# Patient Record
Sex: Male | Born: 1970 | Hispanic: No | Marital: Single | Smoking: Former smoker
Health system: Southern US, Community
[De-identification: ages and names within clinical notes are randomized; demographics above are authoritative.]

## PROBLEM LIST (undated history)

## (undated) DIAGNOSIS — F329 Major depressive disorder, single episode, unspecified: Secondary | ICD-10-CM

## (undated) DIAGNOSIS — I10 Essential (primary) hypertension: Secondary | ICD-10-CM

## (undated) DIAGNOSIS — F419 Anxiety disorder, unspecified: Secondary | ICD-10-CM

## (undated) DIAGNOSIS — E119 Type 2 diabetes mellitus without complications: Secondary | ICD-10-CM

## (undated) DIAGNOSIS — L97209 Non-pressure chronic ulcer of unspecified calf with unspecified severity: Secondary | ICD-10-CM

## (undated) DIAGNOSIS — M1990 Unspecified osteoarthritis, unspecified site: Secondary | ICD-10-CM

## (undated) DIAGNOSIS — E11622 Type 2 diabetes mellitus with other skin ulcer: Secondary | ICD-10-CM

## (undated) DIAGNOSIS — G4730 Sleep apnea, unspecified: Secondary | ICD-10-CM

## (undated) DIAGNOSIS — F32A Depression, unspecified: Secondary | ICD-10-CM

## (undated) HISTORY — PX: HERNIA REPAIR: SHX51

## (undated) HISTORY — PX: TOTAL KNEE ARTHROPLASTY: SHX125

## (undated) HISTORY — PX: TONSILLECTOMY: SUR1361

## (undated) HISTORY — PX: INCISION AND DRAINAGE DEEP NECK ABSCESS: SHX1797

---

## 2001-05-26 ENCOUNTER — Emergency Department (HOSPITAL_COMMUNITY): Admission: EM | Admit: 2001-05-26 | Discharge: 2001-05-26 | Payer: Self-pay | Admitting: Emergency Medicine

## 2001-05-26 ENCOUNTER — Encounter: Payer: Self-pay | Admitting: Emergency Medicine

## 2001-11-09 ENCOUNTER — Emergency Department (HOSPITAL_COMMUNITY): Admission: EM | Admit: 2001-11-09 | Discharge: 2001-11-09 | Payer: Self-pay

## 2002-07-05 ENCOUNTER — Emergency Department (HOSPITAL_COMMUNITY): Admission: EM | Admit: 2002-07-05 | Discharge: 2002-07-05 | Payer: Self-pay | Admitting: Emergency Medicine

## 2002-07-05 ENCOUNTER — Encounter: Payer: Self-pay | Admitting: Emergency Medicine

## 2002-08-03 ENCOUNTER — Encounter: Admission: RE | Admit: 2002-08-03 | Discharge: 2002-11-01 | Payer: Self-pay | Admitting: Orthopedic Surgery

## 2004-09-09 ENCOUNTER — Emergency Department (HOSPITAL_COMMUNITY): Admission: EM | Admit: 2004-09-09 | Discharge: 2004-09-09 | Payer: Self-pay | Admitting: Emergency Medicine

## 2005-04-20 ENCOUNTER — Emergency Department (HOSPITAL_COMMUNITY): Admission: EM | Admit: 2005-04-20 | Discharge: 2005-04-20 | Payer: Self-pay | Admitting: Emergency Medicine

## 2005-05-26 ENCOUNTER — Encounter (INDEPENDENT_AMBULATORY_CARE_PROVIDER_SITE_OTHER): Payer: Self-pay | Admitting: *Deleted

## 2005-05-26 ENCOUNTER — Ambulatory Visit (HOSPITAL_COMMUNITY): Admission: RE | Admit: 2005-05-26 | Discharge: 2005-05-26 | Payer: Self-pay | Admitting: General Surgery

## 2005-10-16 ENCOUNTER — Ambulatory Visit: Payer: Self-pay | Admitting: Pulmonary Disease

## 2005-10-16 ENCOUNTER — Ambulatory Visit (HOSPITAL_BASED_OUTPATIENT_CLINIC_OR_DEPARTMENT_OTHER): Admission: RE | Admit: 2005-10-16 | Discharge: 2005-10-16 | Payer: Self-pay | Admitting: Pulmonary Disease

## 2005-10-26 ENCOUNTER — Ambulatory Visit: Payer: Self-pay | Admitting: Pulmonary Disease

## 2005-11-10 ENCOUNTER — Ambulatory Visit: Payer: Self-pay | Admitting: Pulmonary Disease

## 2005-12-08 ENCOUNTER — Ambulatory Visit: Payer: Self-pay | Admitting: Emergency Medicine

## 2006-02-04 ENCOUNTER — Ambulatory Visit: Payer: Self-pay | Admitting: Pulmonary Disease

## 2006-03-01 ENCOUNTER — Observation Stay (HOSPITAL_COMMUNITY): Admission: RE | Admit: 2006-03-01 | Discharge: 2006-03-02 | Payer: Self-pay | Admitting: Otolaryngology

## 2006-03-01 ENCOUNTER — Encounter (INDEPENDENT_AMBULATORY_CARE_PROVIDER_SITE_OTHER): Payer: Self-pay | Admitting: Specialist

## 2006-10-18 ENCOUNTER — Emergency Department (HOSPITAL_COMMUNITY): Admission: EM | Admit: 2006-10-18 | Discharge: 2006-10-18 | Payer: Self-pay | Admitting: Emergency Medicine

## 2006-10-26 ENCOUNTER — Ambulatory Visit: Payer: Self-pay | Admitting: Emergency Medicine

## 2006-11-02 ENCOUNTER — Ambulatory Visit: Payer: Self-pay | Admitting: Pulmonary Disease

## 2007-03-02 ENCOUNTER — Ambulatory Visit: Payer: Self-pay | Admitting: Pulmonary Disease

## 2007-03-09 ENCOUNTER — Ambulatory Visit: Payer: Self-pay | Admitting: Pulmonary Disease

## 2007-12-26 ENCOUNTER — Emergency Department (HOSPITAL_COMMUNITY): Admission: EM | Admit: 2007-12-26 | Discharge: 2007-12-26 | Payer: Self-pay | Admitting: Emergency Medicine

## 2008-02-18 ENCOUNTER — Emergency Department (HOSPITAL_COMMUNITY): Admission: EM | Admit: 2008-02-18 | Discharge: 2008-02-18 | Payer: Self-pay | Admitting: Emergency Medicine

## 2008-02-27 ENCOUNTER — Emergency Department (HOSPITAL_COMMUNITY): Admission: EM | Admit: 2008-02-27 | Discharge: 2008-02-27 | Payer: Self-pay | Admitting: Emergency Medicine

## 2009-04-10 ENCOUNTER — Emergency Department (HOSPITAL_COMMUNITY): Admission: EM | Admit: 2009-04-10 | Discharge: 2009-04-10 | Payer: Self-pay | Admitting: Emergency Medicine

## 2009-06-01 ENCOUNTER — Emergency Department (HOSPITAL_COMMUNITY): Admission: EM | Admit: 2009-06-01 | Discharge: 2009-06-01 | Payer: Self-pay | Admitting: Emergency Medicine

## 2009-06-15 ENCOUNTER — Emergency Department (HOSPITAL_COMMUNITY): Admission: EM | Admit: 2009-06-15 | Discharge: 2009-06-15 | Payer: Self-pay | Admitting: Emergency Medicine

## 2010-05-28 ENCOUNTER — Emergency Department (HOSPITAL_COMMUNITY): Admission: EM | Admit: 2010-05-28 | Discharge: 2010-05-28 | Payer: Self-pay | Admitting: Emergency Medicine

## 2010-07-26 ENCOUNTER — Emergency Department (HOSPITAL_COMMUNITY): Admission: EM | Admit: 2010-07-26 | Discharge: 2010-07-26 | Payer: Self-pay | Admitting: Emergency Medicine

## 2010-11-16 ENCOUNTER — Emergency Department (HOSPITAL_COMMUNITY)
Admission: EM | Admit: 2010-11-16 | Discharge: 2010-11-16 | Payer: Self-pay | Source: Home / Self Care | Admitting: Emergency Medicine

## 2011-02-09 LAB — GLUCOSE, CAPILLARY
Glucose-Capillary: 386 mg/dL — ABNORMAL HIGH (ref 70–99)
Glucose-Capillary: 492 mg/dL — ABNORMAL HIGH (ref 70–99)
Glucose-Capillary: >600 mg/dL (ref 70–99)

## 2011-02-09 LAB — COMPREHENSIVE METABOLIC PANEL
Albumin: 3.5 g/dL (ref 3.5–5.2)
BUN: 11 mg/dL (ref 6–23)
Chloride: 93 mEq/L — ABNORMAL LOW (ref 96–112)
GFR calc Af Amer: >60 mL/min (ref >60)
GFR calc non Af Amer: >60 mL/min (ref >60)
Potassium: 4.8 mEq/L (ref 3.5–5.1)

## 2011-02-09 LAB — DIFFERENTIAL
Basophils Relative: 0 % (ref 0–1)
Eosinophils Absolute: 0.1 10*3/uL (ref 0.0–0.7)
Eosinophils Relative: 1 % (ref 0–5)
Lymphocytes Relative: 39 % (ref 12–46)

## 2011-02-09 LAB — CBC
HCT: 52.3 % — ABNORMAL HIGH (ref 39.0–52.0)
Hemoglobin: 17.3 g/dL — ABNORMAL HIGH (ref 13.0–17.0)
MCH: 28 pg (ref 26.0–34.0)
MCV: 84.8 fL (ref 78.0–100.0)
RBC: 6.17 MIL/uL — ABNORMAL HIGH (ref 4.22–5.81)

## 2011-02-09 LAB — URINALYSIS, ROUTINE W REFLEX MICROSCOPIC
Bilirubin Urine: NEGATIVE
Glucose, UA: >1000 mg/dL — AB
Hgb urine dipstick: NEGATIVE
Ketones, ur: NEGATIVE mg/dL
Specific Gravity, Urine: 1.029 (ref 1.005–1.030)
pH: 6.5 (ref 5.0–8.0)

## 2011-02-09 LAB — URINE MICROSCOPIC-ADD ON

## 2011-02-13 LAB — DIFFERENTIAL
Basophils Absolute: 0 10*3/uL (ref 0.0–0.1)
Eosinophils Absolute: 0.2 10*3/uL (ref 0.0–0.7)
Neutro Abs: 6.3 10*3/uL (ref 1.7–7.7)
Neutrophils Relative %: 56 % (ref 43–77)

## 2011-02-13 LAB — GLUCOSE, CAPILLARY: Glucose-Capillary: 315 mg/dL — ABNORMAL HIGH (ref 70–99)

## 2011-02-13 LAB — HEPATIC FUNCTION PANEL
AST: 41 U/L — ABNORMAL HIGH (ref 0–37)
Bilirubin, Direct: 0.1 mg/dL (ref 0.0–0.3)

## 2011-02-13 LAB — CBC
HCT: 50.9 % (ref 39.0–52.0)
Hemoglobin: 16.9 g/dL (ref 13.0–17.0)
MCH: 28.3 pg (ref 26.0–34.0)
MCHC: 33.2 g/dL (ref 30.0–36.0)
RBC: 5.98 MIL/uL — ABNORMAL HIGH (ref 4.22–5.81)

## 2011-02-13 LAB — URINALYSIS, ROUTINE W REFLEX MICROSCOPIC
Glucose, UA: >1000 mg/dL — AB
Leukocytes, UA: NEGATIVE
Protein, ur: NEGATIVE mg/dL
Urobilinogen, UA: 1 mg/dL (ref 0.0–1.0)

## 2011-02-13 LAB — BASIC METABOLIC PANEL
BUN: 15 mg/dL (ref 6–23)
Calcium: 8.8 mg/dL (ref 8.4–10.5)
Creatinine, Ser: 1.2 mg/dL (ref 0.4–1.5)
Glucose, Bld: 517 mg/dL — ABNORMAL HIGH (ref 70–99)
Potassium: 3.9 mEq/L (ref 3.5–5.1)
Sodium: 129 mEq/L — ABNORMAL LOW (ref 135–145)

## 2011-02-13 LAB — D-DIMER, QUANTITATIVE: D-Dimer, Quant: 0.33 ug/mL-FEU (ref 0.00–0.48)

## 2011-02-13 LAB — URINE MICROSCOPIC-ADD ON

## 2011-02-13 LAB — POCT CARDIAC MARKERS
CKMB, poc: 2.4 ng/mL (ref 1.0–8.0)
Troponin i, poc: <0.05 ng/mL (ref 0.00–0.09)

## 2011-02-15 LAB — DIFFERENTIAL
Basophils Absolute: 0 10*3/uL (ref 0.0–0.1)
Basophils Relative: 0 % (ref 0–1)
Eosinophils Absolute: 0.1 10*3/uL (ref 0.0–0.7)
Monocytes Absolute: 1.1 10*3/uL — ABNORMAL HIGH (ref 0.1–1.0)
Monocytes Relative: 9 % (ref 3–12)
Neutro Abs: 7.1 10*3/uL (ref 1.7–7.7)
Neutrophils Relative %: 61 % (ref 43–77)

## 2011-02-15 LAB — POCT I-STAT, CHEM 8
Calcium, Ion: 1.02 mmol/L — ABNORMAL LOW (ref 1.12–1.32)
Glucose, Bld: 199 mg/dL — ABNORMAL HIGH (ref 70–99)
HCT: 59 % — ABNORMAL HIGH (ref 39.0–52.0)
Hemoglobin: 20.1 g/dL — ABNORMAL HIGH (ref 13.0–17.0)
Potassium: 3.7 mEq/L (ref 3.5–5.1)

## 2011-02-15 LAB — CBC
Hemoglobin: 16.4 g/dL (ref 13.0–17.0)
MCH: 28 pg (ref 26.0–34.0)
MCHC: 31.9 g/dL (ref 30.0–36.0)
RDW: 15 % (ref 11.5–15.5)
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
## 2011-04-17 NOTE — Assessment & Plan Note (Signed)
[facility]                             PULMONARY OFFICE NOTE   NAME:BECTONElmon, Shader                        MRN:          [id number]  DATE:11/02/2006                            DOB:          [birth year]    I saw Mr. Welshans today for followup of his severe obstructive sleep  apnea.  I   n summary again is that he had his diagnostic study on October 16, 2005, where he was found to have severe obstructive sleep apnea with an  apnea-hypopnea index of 106 and an oxygen saturation nadir of 50% and he  was not able to tolerate CPAP during his second half of his split night  study.  He then had an auto CPAP titration study with minimal use, at  which time he was on a pressure setting of 10, although he did appear to  have significant improvement in his AIH when he using a machine.  He was  then subsequently switched to BiPAP of 14/10.  He had undergone  uvulopharyngoplasty in the spring of 2007.  He says prior to this, he  was just starting to get used to using his BiPAP machine.  He says that,  however, after his surgery, he stopped using his BiPAP machine.  Apparently initially after the surgery, he felt like his sleep had  gotten much better and he was not having much difficulty as far as  feeling sleepy during the day.  Of note is that he was not able to eat  very much after he had his surgery and he lost approximately 20 pounds.  After his surgical site had healed and he was able to eat the way he  normally did before, he regained his weight and his sleep problems  returned.  He currently works 2nd shift from 2 p.m. to 10:30 p.m.  He  will come home by about 11 o'clock at night and he will stay awake until  3 or 4 o'clock in the morning, at which time he goes to sleep; however,  his sleep is quite disrupted and he is having to use the bathroom quite  a bit at night.  He will get up in the morning between 9 and 10 o'clock,  but then ends up falling  asleep several times throughout the remainder  of the day.  He is not currently driving and he is currently in the  process of applying for disability.  He had also looked into undergoing  gastric bypass surgery and he is currently trying an over-the-counter  diet aid.  Otherwise, he is not on any medications.   PHYSICAL EXAM:  He is 323 pounds.  Temperature is 98.  Blood pressure is  130/88.  Heart rate is 106.  Oxygen saturation is 96% on room air.  HEENT:  There is no sinus tenderness, no nasal discharge.  He has a  Mallampati IV airway and he is status post UPPP.  There is no lymphadenopathy.  HEART:  S1 and S2.  CHEST:  There were decreased breath sounds, but no wheezing or rales.  ABDOMEN:  Obese, soft, nontender.  EXTREMITIES:  He has 1+ edema of his ankles.   IMPRESSION:  Very severe obstructive sleep apnea.  He is now status post  uvulopalatopharyngoplasty.  My suspicion is that the reason why he had  initial benefit from his uvulopalatopharyngoplasty is actually due to  his weight loss because he was not able to eat, rather than any  significant benefit from the surgery itself.  However, given the fact  that his upper airway anatomy has changed since he had his surgery, I  would like for him to undergo a repeat titration study to determine what  would be the optimal pressure setting for his bi-level positive airway  pressure machine.  In the meantime, I have discussed with him techniques  to try and acclimatize to the use of his bi-level positive airway  pressure machine and I have advised him to start using his machine again  until we have the results of this repeat titration study.  I have also  had a detailed discussion with him regarding the importance of diet,  exercise and weight reduction.  I will make a referral to the dietitian  to further discuss dietary modifications. I have also discussed with him  that diet aids generally are not effective for long-term use and  could  be associated with untoward health consequences.  With regards to the  gastric bypass surgery, I have reviewed possible side-effects related to  this and I had also discussed with him that even if he were to undergo  this type of surgery, he would still need to have a diet and exercise  program afterwards and therefore it would be best to try and do these  measures beforehand, but certainly gastric bypass surgery could be an  option.  I have also discussed with him that if he is not able to  tolerate bi-level positive airway pressure therapy and he is not able to  lose a significant amount of weight, that he may in fact need to have a  tracheostomy placed to control his sleep apnea, as he would likely be at  high risk for developing significant cardiovascular consequences due to  the severity of his sleep apnea.  Additionally, he is due to bring back  his papers to be filled out for applying for disability due to his sleep  apnea and I again have emphasized the point that he should not drive or  operate heavy machinery until his sleep apnea is under adequate control.     Coralyn Helling, MD  Electronically Signed    VS/MedQ  DD: 11/02/2006  DT: 11/03/2006  Job #: [id number]

## 2011-04-17 NOTE — Op Note (Signed)
NAME:  SALIH, WILLIAMSON               ACCOUNT NO.:  [id number]   MEDICAL RECORD NO.:  [id number]          PATIENT TYPE:  OBV   LOCATION:  3309                         FACILITY:  [facility]   PHYSICIAN:  Kristine Garbe. Ezzard Standing, M.D.DATE OF BIRTH:  1971-04-13   DATE OF PROCEDURE:  03/01/2006  DATE OF DISCHARGE:  03/02/2006                                 OPERATIVE REPORT   PREOPERATIVE DIAGNOSES:  1.  Obstructive sleep apnea with tonsillar hypertrophy.  2.  Turbinate hypertrophy.   POSTOPERATIVE DIAGNOSES:  1.  Obstructive sleep apnea with tonsillar hypertrophy.  2.  Turbinate hypertrophy.   OPERATION PERFORMED:  Bilateral inferior turbinate reductions.  Uvulopalatopharyngoplasty with tonsillectomy.   SURGEON:  Kristine Garbe. Ezzard Standing, M.D.   ANESTHESIA:  General.   COMPLICATIONS:  None.   INDICATIONS FOR PROCEDURE:  Jacub Waiters is a 40 year old gentleman who has  severe sleep apnea. He has been using BIPAP but has had problems with nasal  obstruction and on exam has large turbinates with minimal septal deformity  as well as large tonsils.  He is taken to the operating room at this time  for turbinate reductions to help improve the nasal airway, tonsillectomy and  limited palatoplasty.  Of note he has a very long uvula in addition to the  large tonsils.   DESCRIPTION OF PROCEDURE:  After adequate endotracheal anesthesia, nose was  prepped with cotton pledgets soaked in Afrin.  Turbinates were injected with  Xylocaine with epinephrine.  On exam, the patient had large inferior as well  as middle turbinates.  In addition, he had a large septal spur  posterolaterally on the left side.  Incision was made along the anterior  inferior edge of the inferior turbinates.  Mucosa was elevated off the  turbinate bone and then the submucosal turbinate bone was removed from  inferior turbinates.  The submucosal tissue was then cauterized with suction  cautery and the turbinates were outfractured.   This was performed  bilaterally.  In addition, on the left side of the nose, the patient had a  large septal spur and the septal spur was just removed with Lenoria Chime  forceps.  The mucosa posteriorly where the spur was removed was cauterized  with suction cautery.  This completed the turbinate reductions.  Next, the  patient was turned and the mouth gag was used to expose the oropharynx.  Alexandre had a large tongue base as well as large tonsils and a long uvula.  The tonsils were resected from the tonsillar fossae using a cautery.  In  addition a portion of the posterior tonsillar pillar was excised.  The uvula  was then transected at its base of the attachment to the soft palate.  Hemostasis was obtained with cautery.  Oropharynx was irrigated with saline  and then the cut edges of the palate were reapproximated with 3-0 Vicryl and  3-0 chromic sutures.  This completed the procedure.  Javaun was awakened  from anesthesia and transferred to recovery room postoperatively doing well.   DISPOSITION:  Robel will be observed overnight because of obstructive sleep  apnea.  Will plan on  discharging him in the morning on amoxicillin  suspension 500 mg twice daily for one week, Tylenol with Lortab elixir 15 to  30 mL every four hours as needed for pain. Will have him follow up in my  office in two weeks for recheck.           ______________________________  Kristine Garbe. Ezzard Standing, M.D.     CEN/MEDQ  D:  03/01/2006  T:  03/02/2006  Job:  [id number]   cc:   Marcelyn Bruins, M.D. [facility]  [address]  [place]  [place] [address]

## 2011-04-17 NOTE — Op Note (Signed)
NAME:  Jose Jackson, Jose Jackson               ACCOUNT NO.:  [id number]   MEDICAL RECORD NO.:  [id number]          PATIENT TYPE:  AMB   LOCATION:  DAY                          FACILITY:  [facility]   PHYSICIAN:  Anselm Pancoast. Weatherly, M.D.DATE OF BIRTH:  03/21/1971   DATE OF PROCEDURE:  05/26/2005  DATE OF DISCHARGE:                                 OPERATIVE REPORT   PREOPERATIVE DIAGNOSIS:  Ventral hernia, supraumbilical and also umbilical.   OPERATION:  Repair of a ventral umbilical hernia with mesh.   ANESTHESIA:  General.   SURGEON:  Anselm Pancoast. Zachery Dakins, M.D.   HISTORY:  Jose Jackson is a 40 year old male who weighs about 300 pounds.  He was sent to our office from the [facility] emergency room where he presented  with pain just above the umbilical area.  The patient had an acute abdominal  x-rays and a diagnosis of hernia made.  You could feel the hernia.  I saw  him in the office last week.  This is an egg-sized defect above the  umbilicus with the patient presently working for, I think they make  colostomy supplies.  He is 6 foot 3 and weighs 292 pounds.  He is on no  chronic medications.  I recommended that we repair this with mesh, and the  patient is here for the planned procedure.  The patient originally was  planned over at [facility] outpatient but because of our schedule, we were not  able to get completed by 3:00, so he was switched over to the operating  room.  The patient was taken back to the operative suite, given 1 gm of  Kefzol, and positioned on the table, and induction of general anesthesia.  Patient probably has a history of sleep apnea and small area.  After the  abdomen was prepped with Betadine, sterile scrub and solution, was draped in  a sterile manner.  I made about a 3 inch incision above the umbilicus and  dissected down into the subcutaneous tissue.  There was an egg-sized hernia  sac through about a quarter-sized fascial defect, but he has a defect at the  umbilicus, and  everything is just tented out.  I opened the fascia about 3  inches, then actually removed the hernia sac, and then sent it for  pathology.  I then closed the peritoneum with a running 2-0 Vicryl.  I  worked all the way around so that I could get a piece of mesh probably about  4-5 inches by about 2-1/2 inches in up under the fascial edges on both sides  and then used actually an Endoclose to kind of bring it down below the  umbilicus inferiorly.  This was all done under direct vision.  The mesh is  outside of the peritoneum but inside the fascia.  I placed a midline stitch  of 0 Prolene kind of below the umbilicus, and then the other sutures were  placed through the wound but actually lateral to the fascia so that the mesh  just kind of tented out straight.  There is about three sutures on each  side,  top and bottom, and then the actual fascia was closed, picking up the  piece of mesh enclosed in the fascial sutures, which were also 0 Prolene  interrupted sutures.  The fascia comes together without significant tension,  and then the subcutaneous wound was closed with 3-0 Vicryl and then the skin  closed with staples.  The patient will be released after a short stay in the  recovery room and will be followed and seen in the office for removal of his  staples in approximately a week.  He will be off work for about two weeks.  Hopefully, he will not have other problems.  The patient really needs to  work on dietary management, 300 pounds.     WJW/MEDQ  D:  05/26/2005  T:  05/26/2005  Job:  [id number]

## 2011-04-17 NOTE — Procedures (Signed)
NAME:  Jose Jackson, OFALLON NO.:  [id number]   MEDICAL RECORD NO.:  [id number]          PATIENT TYPE:  OUT   LOCATION:  SLEEP CENTER                 FACILITY:  [facility]   PHYSICIAN:  Marcelyn Bruins, M.D. [facility] DATE OF BIRTH:  [birth year]   DATE OF STUDY:  10/16/2005                              NOCTURNAL POLYSOMNOGRAM   REFERRING PHYSICIAN:  Dr. Marcelyn Bruins   INDICATION FOR STUDY:  Hypersomnia with sleep apnea.   EPWORTH SLEEPINESS SCORE:  16.   SLEEP ARCHITECTURE:  Sleep architecture: The patient total sleep time of 265  minutes with decreased REM and he never achieved slow wave sleep. Sleep  onset was very rapid at 2-1/2 minutes and REM onset was prolonged at 199  minutes.  Sleep efficiency was 75%.   RESPIRATORY DATA:  The patient was found to have 458 apneas and six  hypopneas for a respiratory disturbance index of 106 events per hour. These  events were not positional, but they were very severe  during REM. The  patient did not meet split night criteria and was fitted for nasal pillows  but was unable to tolerate CPAP or BiPAP because of severe anxiety.  There  was moderate snoring noted prior to attempts at C-PAP.   OXYGEN DATA:  There was severe O2 desaturation during REM as low as 50%.   CARDIAC DATA:  No clinically significant cardiac arrhythmias.   MOVEMENT/PARASOMNIA:  The patient had no significant leg jerks or abnormal  behaviors during the night.   IMPRESSION/RECOMMENDATIONS:  Very severe obstructive sleep apnea with a  respiratory disturbance index of 106 events per hour with desaturation as  low as 50%. The patient will need C-PAP therapy as well as aggressive weight  loss.  In order to facilitate tolerance of a positive pressure device I  would initiate BiPAP at home along with and an anxiolytic and  desensitization.           ______________________________  Marcelyn Bruins, M.D. [facility]  Diplomate, American Board of Sleep  Medicine     KC/MEDQ  D:   10/23/2005 13:40:40  T:  10/23/2005 16:21:40  Job:  [id number]

## 2011-08-24 LAB — DIFFERENTIAL
Basophils Relative: 0
Basophils Relative: 1
Eosinophils Absolute: 0.2
Eosinophils Absolute: 0.3
Eosinophils Relative: 1
Monocytes Absolute: 1.1 — ABNORMAL HIGH
Monocytes Absolute: 1.3 — ABNORMAL HIGH
Monocytes Relative: 10
Monocytes Relative: 10
Neutro Abs: 7
Neutrophils Relative %: 52

## 2011-08-24 LAB — COMPREHENSIVE METABOLIC PANEL
ALT: 35
AST: 24
Albumin: 3.7
Alkaline Phosphatase: 63
Chloride: 99
Potassium: 4.1
Sodium: 135
Total Bilirubin: 1.1
Total Protein: 6.5

## 2011-08-24 LAB — CBC
MCHC: 33.1
MCV: 85
Platelets: 279
RBC: 5.89 — ABNORMAL HIGH
RDW: 14.1
RDW: 14.4
WBC: 12.7 — ABNORMAL HIGH

## 2011-08-24 LAB — POCT I-STAT, CHEM 8
Calcium, Ion: 1.12
Creatinine, Ser: 1.1
Glucose, Bld: 112 — ABNORMAL HIGH
Hemoglobin: 19.4 — ABNORMAL HIGH
Potassium: 4.1

## 2011-12-21 IMAGING — CR DG FOOT COMPLETE 3+V*R*
3 series · 3 of 3 positions shown · non-contrast
Comparison: Right foot radiographs 09/09/2004

CLINICAL DATA: Right foot pain and swelling.

RIGHT FOOT COMPLETE - 3+ VIEW

[t foot ap right]
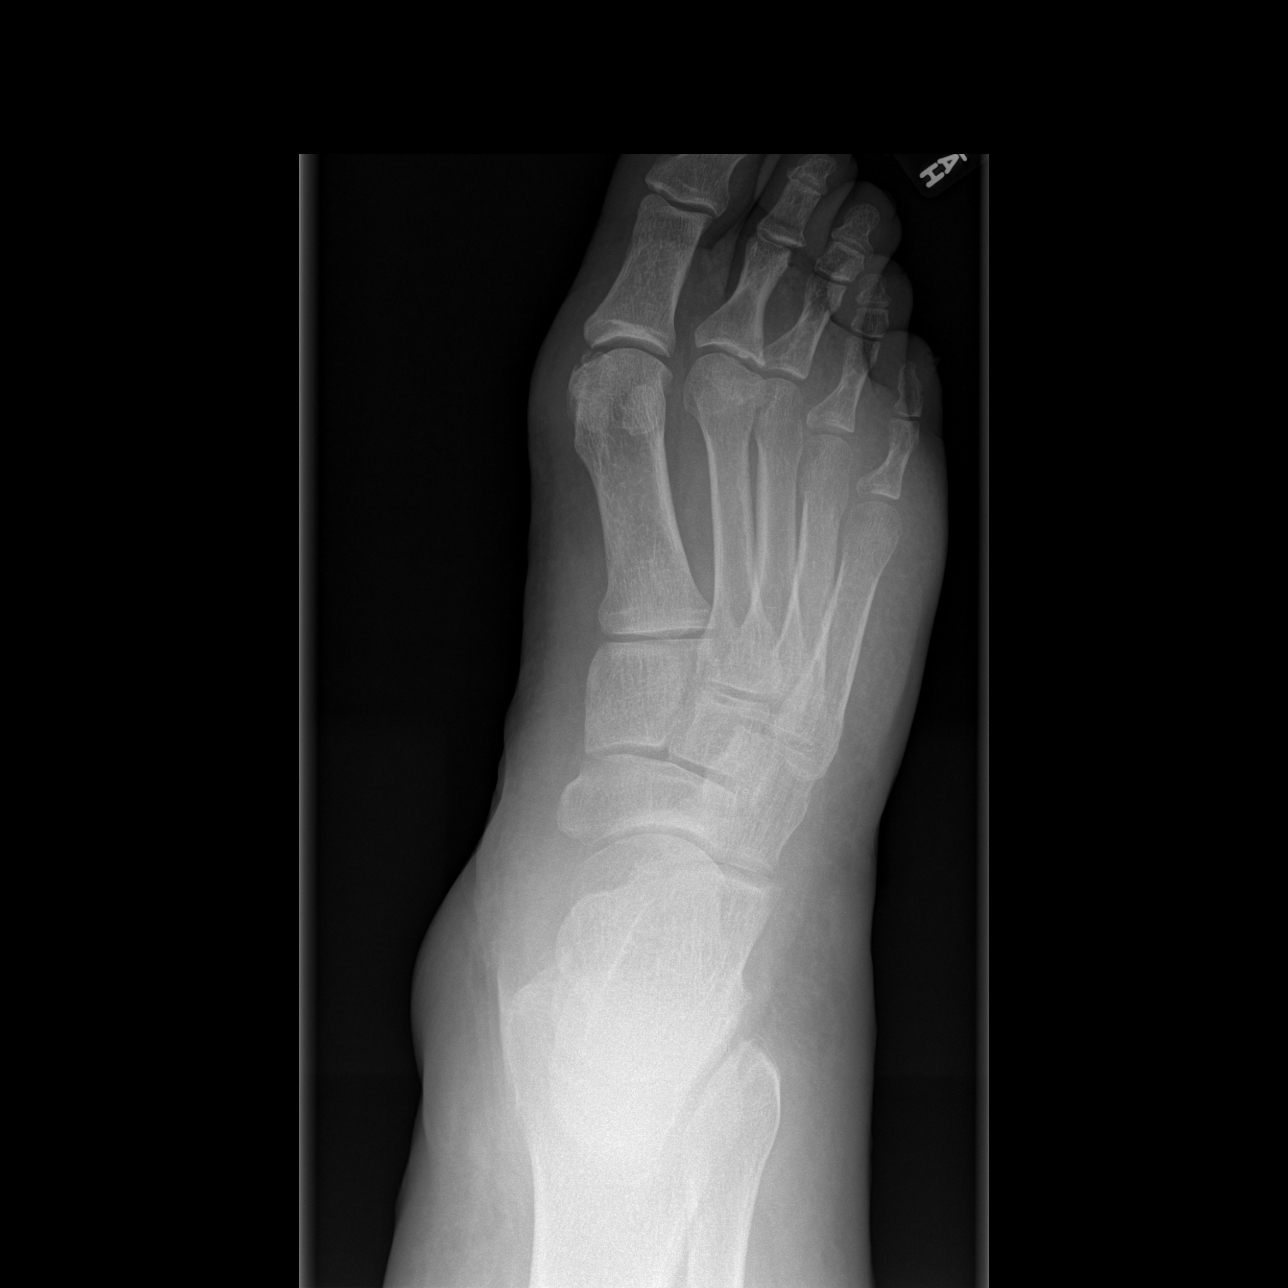

[t foot oblique right]
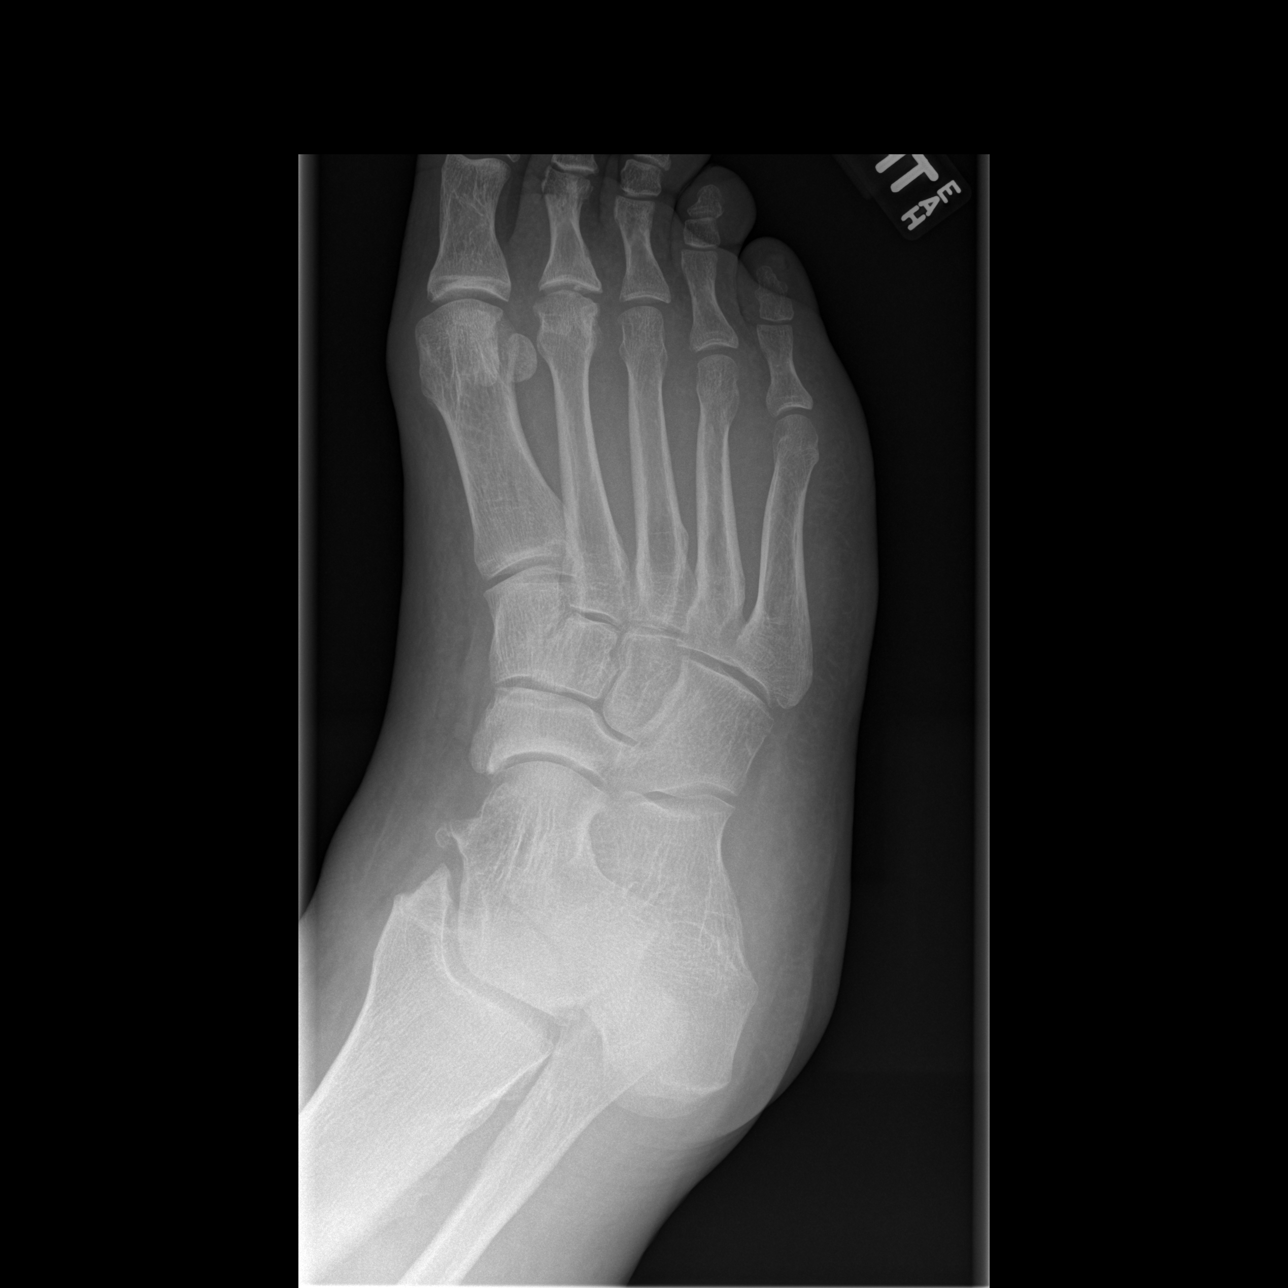

[t foot lat right]
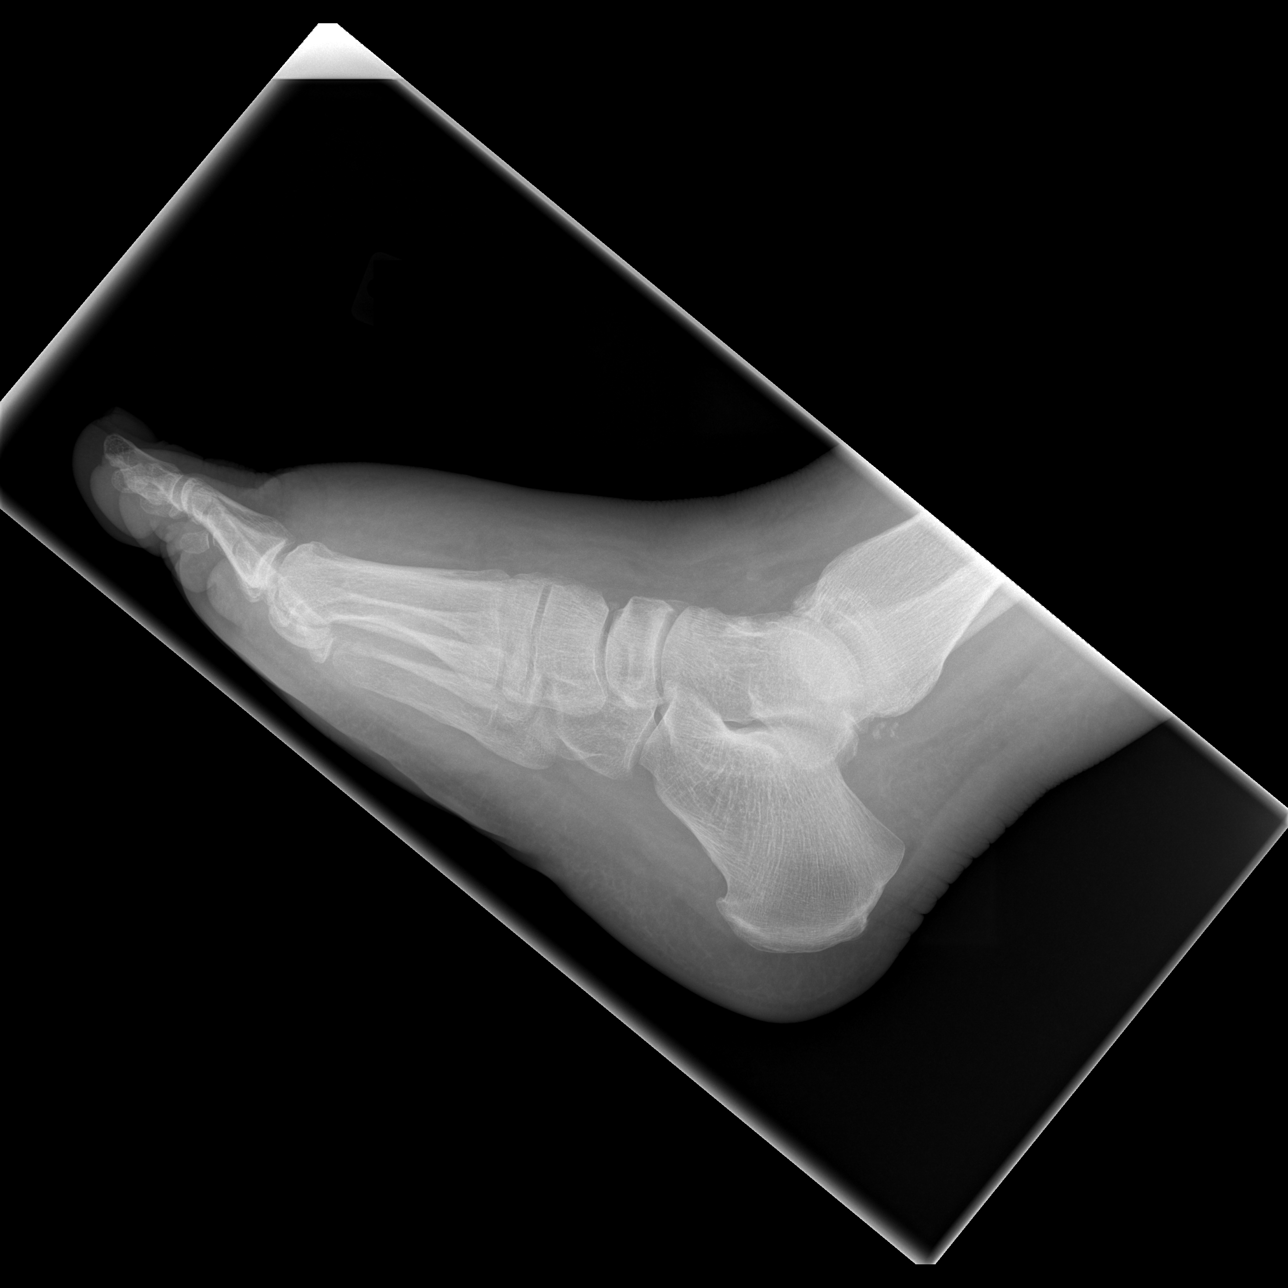

[3 of 3 positions shown; findings below may reference images not displayed]

FINDINGS: The joint spaces are maintained.  There are degenerative
changes at the second metatarsal phalangeal joint and irregularity
of the second metatarsal heads suggesting remote Avena
infraction.  No acute bony findings.  Diffuse, largely dorsal, soft
tissue swelling.
IMPRESSION: 1.  No acute bony findings.
2.  Remote changes of Avena infraction involving the second
metatarsal head.

## 2014-01-08 ENCOUNTER — Encounter (HOSPITAL_COMMUNITY): Payer: Self-pay | Admitting: Emergency Medicine

## 2014-01-08 ENCOUNTER — Inpatient Hospital Stay (HOSPITAL_COMMUNITY)
Admission: EM | Admit: 2014-01-08 | Discharge: 2014-01-09 | DRG: 603 | Disposition: A | Discharge status: Home / Self Care | Payer: Medicare Other | Attending: Family Medicine | Admitting: Family Medicine

## 2014-01-08 ENCOUNTER — Emergency Department (HOSPITAL_COMMUNITY): Payer: Medicare Other

## 2014-01-08 DIAGNOSIS — Z79899 Other long term (current) drug therapy: Secondary | ICD-10-CM

## 2014-01-08 DIAGNOSIS — R0989 Other specified symptoms and signs involving the circulatory and respiratory systems: Secondary | ICD-10-CM | POA: Diagnosis present

## 2014-01-08 DIAGNOSIS — R0609 Other forms of dyspnea: Secondary | ICD-10-CM | POA: Diagnosis present

## 2014-01-08 DIAGNOSIS — L02419 Cutaneous abscess of limb, unspecified: Principal | ICD-10-CM | POA: Diagnosis present

## 2014-01-08 DIAGNOSIS — R0600 Dyspnea, unspecified: Secondary | ICD-10-CM | POA: Diagnosis present

## 2014-01-08 DIAGNOSIS — L03119 Cellulitis of unspecified part of limb: Principal | ICD-10-CM

## 2014-01-08 DIAGNOSIS — E669 Obesity, unspecified: Secondary | ICD-10-CM | POA: Diagnosis present

## 2014-01-08 DIAGNOSIS — I10 Essential (primary) hypertension: Secondary | ICD-10-CM | POA: Diagnosis present

## 2014-01-08 DIAGNOSIS — Z6841 Body Mass Index (BMI) 40.0 and over, adult: Secondary | ICD-10-CM

## 2014-01-08 DIAGNOSIS — E119 Type 2 diabetes mellitus without complications: Secondary | ICD-10-CM | POA: Diagnosis present

## 2014-01-08 DIAGNOSIS — L03115 Cellulitis of right lower limb: Secondary | ICD-10-CM | POA: Diagnosis present

## 2014-01-08 HISTORY — DX: Type 2 diabetes mellitus without complications: E11.9

## 2014-01-08 HISTORY — DX: Major depressive disorder, single episode, unspecified: F32.9

## 2014-01-08 HISTORY — DX: Unspecified osteoarthritis, unspecified site: M19.90

## 2014-01-08 HISTORY — DX: Non-pressure chronic ulcer of unspecified calf with unspecified severity: L97.209

## 2014-01-08 HISTORY — DX: Type 2 diabetes mellitus with other skin ulcer: E11.622

## 2014-01-08 HISTORY — DX: Anxiety disorder, unspecified: F41.9

## 2014-01-08 HISTORY — DX: Sleep apnea, unspecified: G47.30

## 2014-01-08 HISTORY — DX: Essential (primary) hypertension: I10

## 2014-01-08 HISTORY — DX: Depression, unspecified: F32.A

## 2014-01-08 LAB — TROPONIN I
Troponin I: <0.3 ng/mL (ref <0.30)
Troponin I: <0.3 ng/mL (ref <0.30)
Troponin I: <0.3 ng/mL (ref <0.30)

## 2014-01-08 LAB — CBC WITH DIFFERENTIAL/PLATELET
BASOS ABS: 0 10*3/uL (ref 0.0–0.1)
BASOS PCT: 0 % (ref 0–1)
EOS ABS: 0.1 10*3/uL (ref 0.0–0.7)
Eosinophils Relative: 1 % (ref 0–5)
HCT: 54 % — ABNORMAL HIGH (ref 39.0–52.0)
HEMOGLOBIN: 17.5 g/dL — AB (ref 13.0–17.0)
Lymphocytes Relative: 26 % (ref 12–46)
Lymphs Abs: 2.6 10*3/uL (ref 0.7–4.0)
MCH: 28 pg (ref 26.0–34.0)
MCHC: 32.4 g/dL (ref 30.0–36.0)
MCV: 86.5 fL (ref 78.0–100.0)
MONOS PCT: 10 % (ref 3–12)
Monocytes Absolute: 1.1 10*3/uL — ABNORMAL HIGH (ref 0.1–1.0)
NEUTROS ABS: 6.5 10*3/uL (ref 1.7–7.7)
NEUTROS PCT: 63 % (ref 43–77)
PLATELETS: 244 10*3/uL (ref 150–400)
RBC: 6.24 MIL/uL — ABNORMAL HIGH (ref 4.22–5.81)
RDW: 14.8 % (ref 11.5–15.5)
WBC: 10.3 10*3/uL (ref 4.0–10.5)

## 2014-01-08 LAB — COMPREHENSIVE METABOLIC PANEL
ALK PHOS: 70 U/L (ref 39–117)
ALT: 35 U/L (ref 0–53)
AST: 25 U/L (ref 0–37)
Albumin: 3.4 g/dL — ABNORMAL LOW (ref 3.5–5.2)
BILIRUBIN TOTAL: 0.7 mg/dL (ref 0.3–1.2)
BUN: 11 mg/dL (ref 6–23)
CALCIUM: 8.5 mg/dL (ref 8.4–10.5)
CHLORIDE: 101 meq/L (ref 96–112)
CO2: 27 meq/L (ref 19–32)
Creatinine, Ser: 0.87 mg/dL (ref 0.50–1.35)
GLUCOSE: 184 mg/dL — AB (ref 70–99)
Potassium: 4.4 mEq/L (ref 3.7–5.3)
SODIUM: 141 meq/L (ref 137–147)
Total Protein: 6.9 g/dL (ref 6.0–8.3)

## 2014-01-08 LAB — CBC
HCT: 55.2 % — ABNORMAL HIGH (ref 39.0–52.0)
HEMOGLOBIN: 17.8 g/dL — AB (ref 13.0–17.0)
MCH: 28.2 pg (ref 26.0–34.0)
MCHC: 32.2 g/dL (ref 30.0–36.0)
MCV: 87.3 fL (ref 78.0–100.0)
Platelets: 230 10*3/uL (ref 150–400)
RBC: 6.32 MIL/uL — ABNORMAL HIGH (ref 4.22–5.81)
RDW: 15 % (ref 11.5–15.5)
WBC: 10.1 10*3/uL (ref 4.0–10.5)

## 2014-01-08 LAB — CREATININE, SERUM
Creatinine, Ser: 1.22 mg/dL (ref 0.50–1.35)
GFR calc Af Amer: 83 mL/min — ABNORMAL LOW (ref >90)
GFR calc non Af Amer: 72 mL/min — ABNORMAL LOW (ref >90)

## 2014-01-08 LAB — PRO B NATRIURETIC PEPTIDE: PRO B NATRI PEPTIDE: 291.9 pg/mL — AB (ref 0–125)

## 2014-01-08 LAB — GLUCOSE, CAPILLARY
Glucose-Capillary: 186 mg/dL — ABNORMAL HIGH (ref 70–99)
Glucose-Capillary: 181 mg/dL — ABNORMAL HIGH (ref 70–99)

## 2014-01-08 LAB — D-DIMER, QUANTITATIVE (NOT AT ARMC): D DIMER QUANT: 0.37 ug{FEU}/mL (ref 0.00–0.48)

## 2014-01-08 LAB — HEMOGLOBIN A1C
Hgb A1c MFr Bld: 8.7 % — ABNORMAL HIGH (ref <5.7)
Mean Plasma Glucose: 203 mg/dL — ABNORMAL HIGH (ref <117)

## 2014-01-08 LAB — POCT I-STAT TROPONIN I: Troponin i, poc: 0.1 ng/mL (ref 0.00–0.08)

## 2014-01-08 MED ORDER — SODIUM CHLORIDE 0.9 % IJ SOLN
3.0000 mL | Freq: Two times a day (BID) | INTRAMUSCULAR | Status: DC
Start: 2014-01-08 — End: 2014-01-09
  Administered 2014-01-08 – 2014-01-09 (×2): 3 mL via INTRAVENOUS

## 2014-01-08 MED ORDER — SODIUM CHLORIDE 0.9 % IJ SOLN: 3.0000 mL | INTRAMUSCULAR | Status: DC | PRN

## 2014-01-08 MED ORDER — FUROSEMIDE 10 MG/ML IJ SOLN
40.0000 mg | Freq: Two times a day (BID) | INTRAMUSCULAR | Status: DC
  Administered 2014-01-08 – 2014-01-09 (×2): 40 mg via INTRAVENOUS
  Filled 2014-01-08 (×5): qty 4

## 2014-01-08 MED ORDER — POTASSIUM CHLORIDE CRYS ER 20 MEQ PO TBCR
40.0000 meq | EXTENDED_RELEASE_TABLET | Freq: Once | ORAL | Status: AC
  Administered 2014-01-08: 40 meq via ORAL
  Filled 2014-01-08: qty 2

## 2014-01-08 MED ORDER — INSULIN ASPART 100 UNIT/ML ~~LOC~~ SOLN
0.0000 [IU] | Freq: Three times a day (TID) | SUBCUTANEOUS | Status: DC
  Administered 2014-01-08: 2 [IU] via SUBCUTANEOUS
  Administered 2014-01-09 (×2): 1 [IU] via SUBCUTANEOUS

## 2014-01-08 MED ORDER — METOPROLOL TARTRATE 12.5 MG HALF TABLET
12.5000 mg | ORAL_TABLET | Freq: Two times a day (BID) | ORAL | Status: DC
  Administered 2014-01-08 – 2014-01-09 (×3): 12.5 mg via ORAL
  Filled 2014-01-08 (×4): qty 1

## 2014-01-08 MED ORDER — DOXYCYCLINE HYCLATE 100 MG PO TABS
100.0000 mg | ORAL_TABLET | Freq: Two times a day (BID) | ORAL | Status: DC
  Administered 2014-01-08 – 2014-01-09 (×2): 100 mg via ORAL
  Filled 2014-01-08 (×4): qty 1

## 2014-01-08 MED ORDER — DOCUSATE SODIUM 100 MG PO CAPS
100.0000 mg | ORAL_CAPSULE | Freq: Two times a day (BID) | ORAL | Status: DC
  Administered 2014-01-08 – 2014-01-09 (×2): 100 mg via ORAL
  Filled 2014-01-08 (×2): qty 1

## 2014-01-08 MED ORDER — ACETAMINOPHEN 650 MG RE SUPP: 650.0000 mg | Freq: Four times a day (QID) | RECTAL | Status: DC | PRN

## 2014-01-08 MED ORDER — ACETAMINOPHEN 325 MG PO TABS: 650.0000 mg | ORAL_TABLET | Freq: Four times a day (QID) | ORAL | Status: DC | PRN

## 2014-01-08 MED ORDER — SODIUM CHLORIDE 0.9 % IJ SOLN
3.0000 mL | Freq: Two times a day (BID) | INTRAMUSCULAR | Status: DC
  Administered 2014-01-08: 3 mL via INTRAVENOUS

## 2014-01-08 MED ORDER — ASPIRIN 81 MG PO CHEW
324.0000 mg | CHEWABLE_TABLET | Freq: Once | ORAL | Status: AC
  Administered 2014-01-08: 324 mg via ORAL
  Filled 2014-01-08: qty 4

## 2014-01-08 MED ORDER — FUROSEMIDE 10 MG/ML IJ SOLN
40.0000 mg | Freq: Once | INTRAMUSCULAR | Status: AC
  Administered 2014-01-08: 40 mg via INTRAVENOUS
  Filled 2014-01-08: qty 4

## 2014-01-08 MED ORDER — SODIUM CHLORIDE 0.9 % IV SOLN: 250.0000 mL | INTRAVENOUS | Status: DC | PRN

## 2014-01-08 MED ORDER — ASPIRIN EC 81 MG PO TBEC
81.0000 mg | DELAYED_RELEASE_TABLET | Freq: Every day | ORAL | Status: DC
  Administered 2014-01-09: 81 mg via ORAL
  Filled 2014-01-08: qty 1

## 2014-01-08 MED ORDER — ONDANSETRON HCL 4 MG PO TABS: 4.0000 mg | ORAL_TABLET | Freq: Four times a day (QID) | ORAL | Status: DC | PRN

## 2014-01-08 MED ORDER — ONDANSETRON HCL 4 MG/2ML IJ SOLN: 4.0000 mg | Freq: Four times a day (QID) | INTRAMUSCULAR | Status: DC | PRN

## 2014-01-08 MED ORDER — ENOXAPARIN SODIUM 40 MG/0.4ML ~~LOC~~ SOLN
40.0000 mg | SUBCUTANEOUS | Status: DC
  Administered 2014-01-08: 40 mg via SUBCUTANEOUS
  Filled 2014-01-08 (×2): qty 0.4

## 2014-01-08 NOTE — ED Notes (Signed)
Pt c/o stasis ulcer wound to right leg with swelling and leaking fluids; pt sts abd pain from multiple hernias x months

## 2014-01-08 NOTE — ED Provider Notes (Signed)
TIME SEEN: 9:46 AM  CHIEF COMPLAINT: Shortness of breath, peripheral edema, abdominal distention  HPI: Patient is a 43 y.o. M with history of hypertension and type 2 diabetes who presents to the emergency department with several weeks of shortness of breath is worse with exertion and lying flat, increasing peripheral edema and abdominal distention. Patient reports that he can only walk several feet before becoming extremely short of breath. He states he sleeps with several pillows in order to not be short of breath at night. He also has a ulcer to the anterior distal right shin he states his been present there for 4 days. No history of injury to this area. No purulent drainage. No fevers. He denies that he's had any chest pain. He also reports that he's had several weeks of abdominal distention. He states he had a bowel movement this morning but they have not been normal over the past several weeks. He is passing gas intermittently. No prior history of obstruction. He has had prior abdominal surgery for hernias. He does have an umbilical hernia but it is reducible. Denies a history of cardiac disease, CHF. He does not have a primary care physician. He is not taking his medications regularly.  ROS: See HPI Constitutional: no fever  Eyes: no drainage  ENT: no runny nose   Cardiovascular:  no chest pain  Resp:  SOB  GI: no vomiting GU: no dysuria Integumentary: no rash  Allergy: no hives  Musculoskeletal: leg swelling  Neurological: no slurred speech ROS otherwise negative  PAST MEDICAL HISTORY/PAST SURGICAL HISTORY:  Past Medical History  Diagnosis Date  . Hypertension   . Diabetes mellitus without complication     MEDICATIONS:  Prior to Admission medications   Medication Sig Start Date End Date Taking? Authorizing Provider  acetaminophen (TYLENOL) 325 MG tablet Take 325 mg by mouth every 6 (six) hours as needed.   Yes Historical Provider, MD    ALLERGIES:  Allergies  Allergen  Reactions  . Shellfish Allergy Itching    SOCIAL HISTORY:  History  Substance Use Topics  . Smoking status: Never Smoker   . Smokeless tobacco: Not on file  . Alcohol Use: No    FAMILY HISTORY: History reviewed. No pertinent family history.  EXAM: BP 169/130  Pulse 115  Temp(Src) 97.3 F (36.3 C) (Oral)  Resp 18  SpO2 95% CONSTITUTIONAL: Alert and oriented and responds appropriately to questions. Well-appearing; well-nourished, morbidly obese HEAD: Normocephalic EYES: Conjunctivae clear, PERRL ENT: normal nose; no rhinorrhea; moist mucous membranes; pharynx without lesions noted NECK: Supple, no meningismus, no LAD  CARD: tachycardic; S1 and S2 appreciated; no murmurs, no clicks, no rubs, no gallops RESP: Normal chest excursion without splinting; breath sounds clear and equal bilaterally; no wheezes, no rhonchi, no rales, patient becomes tachypneic and speaks in short sentences just with sitting upright in the bed ABD/GI: Normal bowel sounds; non-distended; soft, non-tender, no rebound, no guarding; abdomen is not tympanitic, there is no fluid wave, patient has a small umbilical hernia that is easily reducible and nontender to palpation BACK:  The back appears normal and is non-tender to palpation, there is no CVA tenderness EXT: Normal ROM in all joints; non-tender to palpation; patient has 2+ pitting edema to his bilateral lower extremities to the level of the knee, there is a 5 x 4 cm ulcer to the anterior distal right shin with no purulent drainage, 2+ DP pulses bilaterally SKIN: Normal color for age and race; warm NEURO: Moves all extremities equally  PSYCH: The patient's mood and manner are appropriate. Grooming and personal hygiene are appropriate.  MEDICAL DECISION MAKING: Patient here with multiple complaints. He denies a history of cardiac disease or CHF but states she becomes very short of breath over the past several weeks with minimal exertion. Patient reports that he  knows he is out of shape but states that this is worse than normal. Concern this could be his anginal equivalent versus CHF versus PE. Patient is hypertensive and hyperglycemic.  His lower extremity ulcer appears to be do to edema and likely peripheral vascular disease given his poorly controlled chronic medical conditions. No signs of cellulitis. There is no leukocytosis. He is afebrile. Do not feel antibiotics are indicated at this time. Wound is superficial, no concern for bony involvement. Will obtain BNP, troponin,d dimer, x-ray of chest and abdomen, give Lasix. Suspect patient may need to be admitted for diuresis and ACS rule out. Patient does not have a primary care physician.  ED PROGRESS: Patient has a mildly elevated BNP of 291. His point-of-care troponin is also slightly elevated at 0.10. Will repeat. Otherwise his other labs are unremarkable, d-dimer negative. Chest x-ray clear. Patient's blood pressure has improved to 132/87 without intervention. He is still tachypneic with mild exertion. Have recommended admission.  1:19 PM  Spoke with hospitalist like patient admitted to telemetry, inpatient. Hospitalist also requested a cardiology consult.   EKG Interpretation    Date/Time:  Monday January 08 2014 10:14:56 EST Ventricular Rate:  106 PR Interval:  170 QRS Duration: 85 QT Interval:  349 QTC Calculation: 463 R Axis:   12 Text Interpretation:  Sinus tachycardia Baseline wander in lead(s) V1 V5 Confirmed by WARD  DO, KRISTEN ([id number]) on 01/08/2014 10:31:57 AM             [place], DO 01/08/14 1402

## 2014-01-08 NOTE — ED Notes (Signed)
Called report, sec sts RN will call back

## 2014-01-08 NOTE — Discharge Planning (Signed)
[name], Community Liaison  Spoke to patient about primary care resources. Patient stated that he has medicare but was displaced after his pcp relocated. Medicare pcp list given as well as other resource guides. Patient was also given my contact information for future questions or concerns.

## 2014-01-08 NOTE — H&P (Signed)
[facility] History and Physical  Joeanthony Seeling RKY:[id number] DOB: [birth year] DOA: 01/08/2014  Referring physician: Dr Leonides Schanz.  PCP: No primary provider on file.   Chief Complaint: right LE ulcer.   HPI: Ramiel Forti is a 43 y.o. male with PMH significant for Hypertension, Diabetes. He has not been taking any medications. He stop taking his medication when he was depressed and was having suicidal thought. He read that those medication could cause worsening depression. He doesn't remember names of medications.  He presents today to the ED complaining of right lower extremity edema, and worsening right lower extremity ulcer. He relates ulcer started 4 days prior to admission. He has significant lower extremity edema.   He was notice to be SOB. He relates dyspnea on exertion for last month. He also report orthopnea.  He denies chest pain.   Evaluation in the ED : BNP 291, point of care marker at 0,10. D dimer 0.37. KUB/Chest x ray with no acute abnormality.    Review of Systems:  Negative.   Past Medical History  Diagnosis Date  . Hypertension   . Diabetes mellitus without complication    History reviewed. No pertinent past surgical history.  Social History: Reports that he has never smoked. He does not have any smokeless tobacco history on file. He reports that he does not drink alcohol. Use marihuana 3 weeks ago. He is divorced. He is on disability.   Allergies  Allergen Reactions  . Shellfish Allergy Itching    Family History: Father; died of alcohol cirrhosis, alcoholism. Mother; Diabetes.   Prior to Admission medications   Medication Sig Start Date End Date Taking? Authorizing Provider  acetaminophen (TYLENOL) 325 MG tablet Take 325 mg by mouth every 6 (six) hours as needed.   Yes Historical Provider, MD   Physical Exam: Filed Vitals:   01/08/14 1358  BP: 150/84  Pulse: 115  Temp:   Resp: 24    BP 150/84  Pulse 115  Temp(Src) 97.6 F (36.4 C) (Oral)  Resp  24  Ht 6\' 3"  (1.905 m)  Wt 136.079 kg (300 lb)  BMI 37.50 kg/m2  SpO2 94%  General:  Appears calm and comfortable, obese.  Eyes: PERRL, normal lids, irises & conjunctiva ENT: grossly normal hearing, lips & tongue Neck: no LAD, masses or thyromegaly Cardiovascular: RRR, no m/r/g.  Telemetry: SR, no arrhythmias  Respiratory: distant lung sound,  no w/r/r. Normal respiratory effort. Abdomen: soft, ntnd Skin: no rash or induration seen on limited exam Musculoskeletal: plus 2 lower extremities edema, right lower extremity with coin size ulcer, redness, oozing.  Psychiatric: grossly normal mood and affect, speech fluent and appropriate Neurologic: grossly non-focal.          Labs on Admission:  Basic Metabolic Panel:  Recent Labs Lab 01/08/14 0828  NA 141  K 4.4  CL 101  CO2 27  GLUCOSE 184*  BUN 11  CREATININE 0.87  CALCIUM 8.5   Liver Function Tests:  Recent Labs Lab 01/08/14 0828  AST 25  ALT 35  ALKPHOS 70  BILITOT 0.7  PROT 6.9  ALBUMIN 3.4*    CBC:  Recent Labs Lab 01/08/14 0828  WBC 10.3  NEUTROABS 6.5  HGB 17.5*  HCT 54.0*  MCV 86.5  PLT 244   Cardiac Enzymes:  Recent Labs Lab 01/08/14 1020  TROPONINI <0.30    BNP (last 3 results)  Recent Labs  01/08/14 1020  PROBNP 291.9*   CBG: No results found for this basename: GLUCAP,  in the last 168 hours  Radiological Exams on Admission: Dg Abd Acute W/chest  01/08/2014   CLINICAL DATA:  Abdominal distention.  Shortness of breath.  EXAM: ACUTE ABDOMEN SERIES (ABDOMEN 2 VIEW & CHEST 1 VIEW)  COMPARISON:  DG CHEST 1 VIEW dated 07/26/2010; DG CHEST 2 VIEW dated 02/27/2008; CT ABD PELVIS W/CM dated 02/18/2008; DG ABD ACUTE W/CHEST dated 04/20/2005  FINDINGS: Bowel gas pattern unremarkable without evidence of obstruction or significant ileus. No evidence of free air or significant air-fluid levels on the erect image. Expected stool burden in the colon. Phleboliths in the right side of the pelvis. No  visible opaque urinary tract calculi. Visible psoas margins. Regional skeleton intact; note made of a transitional L5 segment with a well defined assimilation joint between the its right transverse process and the first sacral segment.  Cardiac silhouette mildly enlarged but stable. Hilar and mediastinal contours otherwise unremarkable. Lungs clear. Bronchovascular markings normal. Pulmonary vascularity normal. No visible pleural effusions. No pneumothorax.  IMPRESSION: 1. No acute abdominal abnormality. 2. No acute cardiopulmonary disease.   Electronically Signed   By: Evangeline Dakin M.D.   On: 01/08/2014 10:05    EKG: Independently reviewed. Sinus Tachycardia.   Assessment/Plan Active Problems:   Dyspnea   Cellulitis of right lower extremity  1- Dyspnea, orthopnea:  Concern for Heart failure.  Will admit to telemetry, continue to cycle cardiac enzymes.  IV lasix 40 Mg IV BID.  ECHO order.  D dimer 0.37.  2-Increase troponin; point of care marker. Patient with dyspnea. Continue to cycle troponin. ECHO ordered. Cardiology evaluation.  3-Right Lower extremity cellulitis, ulcer; mild. Will start doxycycline.  4-Diabetes; Will order Hb A1-c. SSI. Will need to discussed option for outpatient therapy with patient depending on HB A1c level.  5-Hypertension; start metoprolol. IV lasix.    Code Status: Full Code.  Family Communication: Care discussed with patient.  Disposition Plan: expect 2 to 3 days.   Time spent: 75 minutes.   [facility] Pager [phone]

## 2014-01-08 NOTE — ED Notes (Signed)
Results of troponin given to Ward, DO 

## 2014-01-08 NOTE — ED Notes (Signed)
RN Tammy unable to take report at this time

## 2014-01-09 DIAGNOSIS — I369 Nonrheumatic tricuspid valve disorder, unspecified: Secondary | ICD-10-CM

## 2014-01-09 DIAGNOSIS — I10 Essential (primary) hypertension: Secondary | ICD-10-CM | POA: Diagnosis present

## 2014-01-09 LAB — CBC
HEMATOCRIT: 55.9 % — AB (ref 39.0–52.0)
HEMOGLOBIN: 18.4 g/dL — AB (ref 13.0–17.0)
MCH: 28.2 pg (ref 26.0–34.0)
MCHC: 32.9 g/dL (ref 30.0–36.0)
MCV: 85.6 fL (ref 78.0–100.0)
Platelets: 170 10*3/uL (ref 150–400)
RBC: 6.53 MIL/uL — ABNORMAL HIGH (ref 4.22–5.81)
RDW: 15 % (ref 11.5–15.5)
WBC: 10.4 10*3/uL (ref 4.0–10.5)

## 2014-01-09 LAB — GLUCOSE, CAPILLARY
Glucose-Capillary: 140 mg/dL — ABNORMAL HIGH (ref 70–99)
Glucose-Capillary: 145 mg/dL — ABNORMAL HIGH (ref 70–99)

## 2014-01-09 LAB — BASIC METABOLIC PANEL
BUN: 12 mg/dL (ref 6–23)
CHLORIDE: 98 meq/L (ref 96–112)
CO2: 27 mEq/L (ref 19–32)
Calcium: 8.7 mg/dL (ref 8.4–10.5)
Creatinine, Ser: 0.82 mg/dL (ref 0.50–1.35)
GFR calc Af Amer: >90 mL/min (ref >90)
GFR calc non Af Amer: >90 mL/min (ref >90)
Glucose, Bld: 159 mg/dL — ABNORMAL HIGH (ref 70–99)
Potassium: 4.5 mEq/L (ref 3.7–5.3)
Sodium: 141 mEq/L (ref 137–147)

## 2014-01-09 LAB — TROPONIN I: Troponin I: <0.3 ng/mL (ref <0.30)

## 2014-01-09 MED ORDER — METOPROLOL TARTRATE 25 MG PO TABS: 25.0000 mg | ORAL_TABLET | Freq: Two times a day (BID) | ORAL | Status: DC

## 2014-01-09 MED ORDER — ENOXAPARIN SODIUM 60 MG/0.6ML ~~LOC~~ SOLN
60.0000 mg | SUBCUTANEOUS | Status: DC
  Filled 2014-01-09: qty 0.6

## 2014-01-09 MED ORDER — METOPROLOL TARTRATE 25 MG PO TABS
25.0000 mg | ORAL_TABLET | Freq: Two times a day (BID) | ORAL | Status: DC
  Filled 2014-01-09: qty 1

## 2014-01-09 MED ORDER — METOPROLOL TARTRATE 12.5 MG HALF TABLET
12.5000 mg | ORAL_TABLET | Freq: Once | ORAL | Status: AC
  Administered 2014-01-09: 12.5 mg via ORAL
  Filled 2014-01-09: qty 1

## 2014-01-09 MED ORDER — DOXYCYCLINE HYCLATE 100 MG PO TABS: 100.0000 mg | ORAL_TABLET | Freq: Two times a day (BID) | ORAL | Status: DC

## 2014-01-09 NOTE — Progress Notes (Signed)
  Echocardiogram 2D Echocardiogram has been performed.  Jose Jackson 01/09/2014, 10:53 AM

## 2014-01-09 NOTE — Progress Notes (Signed)
Inpatient Diabetes Program Recommendations  AACE/ADA: New Consensus Statement on Inpatient Glycemic Control (2013)  Target Ranges:  Prepandial:   less than 140 mg/dL      Peak postprandial:   less than 180 mg/dL (1-2 hours)      Critically ill patients:  140 - 180 mg/dL   Inpatient Diabetes Program Recommendations Oral Agents: consider adding Metformin  HgbA1C: =8.7  Note: This coordinator spoke with patient concerning A1C=8.7  Encouraged dietary changes to include whole natural foods and less processed or fast food.  Also encouraged exercise of upper body (ie. Lifting gallon water jugs) until he is able to walk farther.  Encouraged patient to make small changes and keep his goal in sight.  Patient is motivated to make changes because he has four young daughters that are worried about him.  He does not have a glucose meter so recommended the ReliOn meter and strips from [employer] or if he goes to the [facility] and [facility] they may give him one.  Patient does not have any further questions/concerns at the end of our conversation. Thank you  Raoul Pitch BSN, RN,CDE Inpatient Diabetes Coordinator [phone] (team pager)

## 2014-01-09 NOTE — Progress Notes (Signed)
Utilization review completed. Geovana Gebel, RN, BSN. 

## 2014-01-09 NOTE — Progress Notes (Signed)
Pt discharged home with family friend. Discharge instructions provided by Janyth Pupa, RN. All questions answered, pt verbalized understanding. Pt ambulated off unit with his personal belonging.

## 2014-01-09 NOTE — Discharge Summary (Signed)
Physician Discharge Summary  Jose Jackson [id number] DOB: Jan 12, 1971 DOA: 01/08/2014  PCP: Theressa Millard, MD  Admit date: 01/08/2014 Discharge date: 01/09/2014  Time spent: > 35 minutes  Recommendations for Outpatient Follow-up:  1. Monitor blood pressures and adjust antihypertensive medication as needed pending blood pressures  Discharge Diagnoses:  Active Problems:   Dyspnea   Cellulitis of right lower extremity Hypertension  Discharge Condition: stable  Diet recommendation: low sodium heart healthy  Filed Weights   01/08/14 1225 01/08/14 1452 01/09/14 0404  Weight: 136.079 kg (300 lb) 136.07 kg (299 lb 15.7 oz) 178.808 kg (394 lb 3.2 oz)    History of present illness:  The patient is a 43 year old African American male with obesity who presented to the ED after developing dyspnea going up a flight of stairs. As a result he presented to the ED for evaluation. Patient was found to have elevation in i-STAT troponin and was brought in for ACS rule out. Although he denied any chest pain initially and during hospital stay.  Hospital Course:  Dyspnea - Resolved echocardiogram reviewed and EF noted to be within normal limits. There were no visual wall motion abnormalities - Troponins x3 were negative - Chest x-ray reported no acute cardiopulmonary disease - Dyspnea most likely due to deconditioning and I have encouraged patient to increase his exercise regimen  Hypertension - Will place on metoprolol  Cellulitis of right lower extremity - Discharge on 7 more days of doxycycline. Patient is to followup with primary care physician for evaluation to see if longer course of antibiotic regimen warranteded  Procedures:  Echocardiogram: Ef 55%  Consultations:  None  Discharge Exam: Filed Vitals:   01/09/14 1159  BP: 168/90  Pulse: 103  Temp:   Resp:     General: Pt in NAd, Alert and awake Cardiovascular: RRR, no MRG Respiratory: CTA BL, no wheezes  Discharge  Instructions  Discharge Orders   Future Orders Complete By Expires   Call MD for:  redness, tenderness, or signs of infection (pain, swelling, redness, odor or green/yellow discharge around incision site)  As directed    Call MD for:  temperature >100.4  As directed    Diet - low sodium heart healthy  As directed    Discharge instructions  As directed    Comments:     Please follow up with your primary care physician in 1-2 weeks or sooner should any new concerns arise.   Increase activity slowly  As directed        Medication List         acetaminophen 325 MG tablet  Commonly known as:  TYLENOL  Take 325 mg by mouth every 6 (six) hours as needed.     doxycycline 100 MG tablet  Commonly known as:  VIBRA-TABS  Take 1 tablet (100 mg total) by mouth every 12 (twelve) hours.     metoprolol tartrate 25 MG tablet  Commonly known as:  LOPRESSOR  Take 1 tablet (25 mg total) by mouth 2 (two) times daily.       Allergies  Allergen Reactions  . Shellfish Allergy Itching      The results of significant diagnostics from this hospitalization (including imaging, microbiology, ancillary and laboratory) are listed below for reference.    Significant Diagnostic Studies: Dg Abd Acute W/chest  01/08/2014   CLINICAL DATA:  Abdominal distention.  Shortness of breath.  EXAM: ACUTE ABDOMEN SERIES (ABDOMEN 2 VIEW & CHEST 1 VIEW)  COMPARISON:  DG CHEST 1 VIEW dated  07/26/2010; DG CHEST 2 VIEW dated 02/27/2008; CT ABD PELVIS W/CM dated 02/18/2008; DG ABD ACUTE W/CHEST dated 04/20/2005  FINDINGS: Bowel gas pattern unremarkable without evidence of obstruction or significant ileus. No evidence of free air or significant air-fluid levels on the erect image. Expected stool burden in the colon. Phleboliths in the right side of the pelvis. No visible opaque urinary tract calculi. Visible psoas margins. Regional skeleton intact; note made of a transitional L5 segment with a well defined assimilation joint between  the its right transverse process and the first sacral segment.  Cardiac silhouette mildly enlarged but stable. Hilar and mediastinal contours otherwise unremarkable. Lungs clear. Bronchovascular markings normal. Pulmonary vascularity normal. No visible pleural effusions. No pneumothorax.  IMPRESSION: 1. No acute abdominal abnormality. 2. No acute cardiopulmonary disease.   Electronically Signed   By: Evangeline Dakin M.D.   On: 01/08/2014 10:05    Microbiology: No results found for this or any previous visit (from the past 240 hour(s)).   Labs: Basic Metabolic Panel:  Recent Labs Lab 01/08/14 0828 01/08/14 1517 01/09/14 0235  NA 141  --  141  K 4.4  --  4.5  CL 101  --  98  CO2 27  --  27  GLUCOSE 184*  --  159*  BUN 11  --  12  CREATININE 0.87 1.22 0.82  CALCIUM 8.5  --  8.7   Liver Function Tests:  Recent Labs Lab 01/08/14 0828  AST 25  ALT 35  ALKPHOS 70  BILITOT 0.7  PROT 6.9  ALBUMIN 3.4*   No results found for this basename: LIPASE, AMYLASE,  in the last 168 hours No results found for this basename: AMMONIA,  in the last 168 hours CBC:  Recent Labs Lab 01/08/14 0828 01/08/14 1517 01/09/14 0235  WBC 10.3 10.1 10.4  NEUTROABS 6.5  --   --   HGB 17.5* 17.8* 18.4*  HCT 54.0* 55.2* 55.9*  MCV 86.5 87.3 85.6  PLT 244 230 170   Cardiac Enzymes:  Recent Labs Lab 01/08/14 1020 01/08/14 1517 01/08/14 2005 01/09/14 0235  TROPONINI <0.30 <0.30 <0.30 <0.30   BNP: BNP (last 3 results)  Recent Labs  01/08/14 1020  PROBNP 291.9*   CBG:  Recent Labs Lab 01/08/14 1623 01/08/14 2133 01/09/14 0620 01/09/14 1104  GLUCAP 186* 181* 140* 145*       Signed:  Robbye Dede  [facility] 01/09/2014, 1:40 PM

## 2014-04-11 ENCOUNTER — Inpatient Hospital Stay (HOSPITAL_COMMUNITY)
Admission: EM | Admit: 2014-04-11 | Discharge: 2014-04-16 | DRG: 638 | Disposition: A | Discharge status: Home Health | Payer: Medicare Other | Attending: Internal Medicine | Admitting: Internal Medicine

## 2014-04-11 ENCOUNTER — Emergency Department (HOSPITAL_COMMUNITY): Payer: Medicare Other

## 2014-04-11 ENCOUNTER — Encounter (HOSPITAL_COMMUNITY): Payer: Self-pay | Admitting: Emergency Medicine

## 2014-04-11 DIAGNOSIS — E1165 Type 2 diabetes mellitus with hyperglycemia: Principal | ICD-10-CM | POA: Diagnosis present

## 2014-04-11 DIAGNOSIS — Z6841 Body Mass Index (BMI) 40.0 and over, adult: Secondary | ICD-10-CM

## 2014-04-11 DIAGNOSIS — L03115 Cellulitis of right lower limb: Secondary | ICD-10-CM | POA: Diagnosis present

## 2014-04-11 DIAGNOSIS — E119 Type 2 diabetes mellitus without complications: Secondary | ICD-10-CM

## 2014-04-11 DIAGNOSIS — G4730 Sleep apnea, unspecified: Secondary | ICD-10-CM | POA: Diagnosis present

## 2014-04-11 DIAGNOSIS — IMO0002 Reserved for concepts with insufficient information to code with codable children: Principal | ICD-10-CM | POA: Diagnosis present

## 2014-04-11 DIAGNOSIS — Z9119 Patient's noncompliance with other medical treatment and regimen: Secondary | ICD-10-CM

## 2014-04-11 DIAGNOSIS — L02419 Cutaneous abscess of limb, unspecified: Secondary | ICD-10-CM | POA: Diagnosis present

## 2014-04-11 DIAGNOSIS — E1169 Type 2 diabetes mellitus with other specified complication: Principal | ICD-10-CM

## 2014-04-11 DIAGNOSIS — R0600 Dyspnea, unspecified: Secondary | ICD-10-CM

## 2014-04-11 DIAGNOSIS — R609 Edema, unspecified: Secondary | ICD-10-CM | POA: Diagnosis present

## 2014-04-11 DIAGNOSIS — I10 Essential (primary) hypertension: Secondary | ICD-10-CM | POA: Diagnosis present

## 2014-04-11 DIAGNOSIS — Z91199 Patient's noncompliance with other medical treatment and regimen due to unspecified reason: Secondary | ICD-10-CM

## 2014-04-11 DIAGNOSIS — L97909 Non-pressure chronic ulcer of unspecified part of unspecified lower leg with unspecified severity: Secondary | ICD-10-CM | POA: Diagnosis present

## 2014-04-11 DIAGNOSIS — L03119 Cellulitis of unspecified part of limb: Secondary | ICD-10-CM

## 2014-04-11 LAB — CBC WITH DIFFERENTIAL/PLATELET
Basophils Absolute: 0 10*3/uL (ref 0.0–0.1)
Basophils Relative: 0 % (ref 0–1)
EOS ABS: 0.1 10*3/uL (ref 0.0–0.7)
EOS PCT: 1 % (ref 0–5)
HCT: 53.9 % — ABNORMAL HIGH (ref 39.0–52.0)
Hemoglobin: 17 g/dL (ref 13.0–17.0)
Lymphocytes Relative: 31 % (ref 12–46)
Lymphs Abs: 2.3 10*3/uL (ref 0.7–4.0)
MCH: 26.6 pg (ref 26.0–34.0)
MCHC: 31.5 g/dL (ref 30.0–36.0)
MCV: 84.2 fL (ref 78.0–100.0)
MONOS PCT: 16 % — AB (ref 3–12)
Monocytes Absolute: 1.1 10*3/uL — ABNORMAL HIGH (ref 0.1–1.0)
NEUTROS PCT: 52 % (ref 43–77)
Neutro Abs: 3.7 10*3/uL (ref 1.7–7.7)
Platelets: 227 10*3/uL (ref 150–400)
RBC: 6.4 MIL/uL — ABNORMAL HIGH (ref 4.22–5.81)
RDW: 15.2 % (ref 11.5–15.5)
WBC: 7.3 10*3/uL (ref 4.0–10.5)

## 2014-04-11 LAB — BASIC METABOLIC PANEL
BUN: 16 mg/dL (ref 6–23)
CALCIUM: 8.7 mg/dL (ref 8.4–10.5)
CO2: 26 mEq/L (ref 19–32)
Chloride: 99 mEq/L (ref 96–112)
Creatinine, Ser: 0.85 mg/dL (ref 0.50–1.35)
Glucose, Bld: 114 mg/dL — ABNORMAL HIGH (ref 70–99)
Potassium: 4.4 mEq/L (ref 3.7–5.3)
SODIUM: 138 meq/L (ref 137–147)

## 2014-04-11 LAB — LACTIC ACID, PLASMA: LACTIC ACID, VENOUS: 1.6 mmol/L (ref 0.5–2.2)

## 2014-04-11 LAB — HEPATIC FUNCTION PANEL
ALT: 18 U/L (ref 0–53)
AST: 19 U/L (ref 0–37)
Albumin: 3 g/dL — ABNORMAL LOW (ref 3.5–5.2)
Alkaline Phosphatase: 64 U/L (ref 39–117)
BILIRUBIN TOTAL: 0.9 mg/dL (ref 0.3–1.2)
Total Protein: 7 g/dL (ref 6.0–8.3)

## 2014-04-11 LAB — GLUCOSE, CAPILLARY: GLUCOSE-CAPILLARY: 110 mg/dL — AB (ref 70–99)

## 2014-04-11 MED ORDER — FOLIC ACID 1 MG PO TABS
1.0000 mg | ORAL_TABLET | Freq: Every day | ORAL | Status: DC
  Administered 2014-04-11 – 2014-04-16 (×6): 1 mg via ORAL
  Filled 2014-04-11 (×6): qty 1

## 2014-04-11 MED ORDER — MAGNESIUM CITRATE PO SOLN: 1.0000 | Freq: Once | ORAL | Status: AC | PRN

## 2014-04-11 MED ORDER — HYDROMORPHONE HCL PF 1 MG/ML IJ SOLN
1.0000 mg | Freq: Once | INTRAMUSCULAR | Status: AC
  Administered 2014-04-11: 1 mg via INTRAVENOUS
  Filled 2014-04-11: qty 1

## 2014-04-11 MED ORDER — VANCOMYCIN HCL 10 G IV SOLR
2500.0000 mg | Freq: Once | INTRAVENOUS | Status: AC
  Administered 2014-04-11: 2500 mg via INTRAVENOUS
  Filled 2014-04-11: qty 2500

## 2014-04-11 MED ORDER — CLINDAMYCIN PHOSPHATE 600 MG/50ML IV SOLN
600.0000 mg | Freq: Once | INTRAVENOUS | Status: AC
  Administered 2014-04-11: 600 mg via INTRAVENOUS
  Filled 2014-04-11: qty 50

## 2014-04-11 MED ORDER — VITAMIN B-1 100 MG PO TABS
100.0000 mg | ORAL_TABLET | Freq: Every day | ORAL | Status: DC
  Administered 2014-04-11 – 2014-04-16 (×6): 100 mg via ORAL
  Filled 2014-04-11 (×6): qty 1

## 2014-04-11 MED ORDER — HYDROCODONE-ACETAMINOPHEN 5-325 MG PO TABS
1.0000 | ORAL_TABLET | ORAL | Status: DC | PRN
  Administered 2014-04-11 – 2014-04-12 (×2): 2 via ORAL
  Filled 2014-04-11 (×2): qty 2

## 2014-04-11 MED ORDER — PNEUMOCOCCAL VAC POLYVALENT 25 MCG/0.5ML IJ INJ
0.5000 mL | INJECTION | INTRAMUSCULAR | Status: DC
  Filled 2014-04-11 (×2): qty 0.5

## 2014-04-11 MED ORDER — SODIUM CHLORIDE 0.9 % IV SOLN
Freq: Once | INTRAVENOUS | Status: AC
  Administered 2014-04-11: 18:00:00 via INTRAVENOUS

## 2014-04-11 MED ORDER — BISACODYL 10 MG RE SUPP: 10.0000 mg | Freq: Every day | RECTAL | Status: DC | PRN

## 2014-04-11 MED ORDER — DOCUSATE SODIUM 100 MG PO CAPS
100.0000 mg | ORAL_CAPSULE | Freq: Two times a day (BID) | ORAL | Status: DC
  Administered 2014-04-11 – 2014-04-16 (×9): 100 mg via ORAL
  Filled 2014-04-11 (×11): qty 1

## 2014-04-11 MED ORDER — PIPERACILLIN-TAZOBACTAM 3.375 G IVPB 30 MIN
3.3750 g | Freq: Once | INTRAVENOUS | Status: AC
  Administered 2014-04-11: 3.375 g via INTRAVENOUS
  Filled 2014-04-11: qty 50

## 2014-04-11 MED ORDER — ONDANSETRON HCL 4 MG/2ML IJ SOLN: 4.0000 mg | Freq: Four times a day (QID) | INTRAMUSCULAR | Status: DC | PRN

## 2014-04-11 MED ORDER — ALUM & MAG HYDROXIDE-SIMETH 200-200-20 MG/5ML PO SUSP: 30.0000 mL | Freq: Four times a day (QID) | ORAL | Status: DC | PRN

## 2014-04-11 MED ORDER — SODIUM CHLORIDE 0.9 % IV SOLN
INTRAVENOUS | Status: DC
  Administered 2014-04-11: 22:00:00 via INTRAVENOUS

## 2014-04-11 MED ORDER — ADULT MULTIVITAMIN W/MINERALS CH
1.0000 | ORAL_TABLET | Freq: Every day | ORAL | Status: DC
Start: 2014-04-11 — End: 2014-04-16
  Administered 2014-04-11 – 2014-04-16 (×6): 1 via ORAL
  Filled 2014-04-11 (×6): qty 1

## 2014-04-11 MED ORDER — INSULIN ASPART 100 UNIT/ML ~~LOC~~ SOLN: 0.0000 [IU] | Freq: Every day | SUBCUTANEOUS | Status: DC

## 2014-04-11 MED ORDER — ACETAMINOPHEN 650 MG RE SUPP: 650.0000 mg | Freq: Four times a day (QID) | RECTAL | Status: DC | PRN

## 2014-04-11 MED ORDER — INSULIN ASPART 100 UNIT/ML ~~LOC~~ SOLN
0.0000 [IU] | Freq: Three times a day (TID) | SUBCUTANEOUS | Status: DC
  Administered 2014-04-12: 3 [IU] via SUBCUTANEOUS
  Administered 2014-04-13 (×2): 2 [IU] via SUBCUTANEOUS

## 2014-04-11 MED ORDER — ACETAMINOPHEN 325 MG PO TABS: 650.0000 mg | ORAL_TABLET | Freq: Four times a day (QID) | ORAL | Status: DC | PRN

## 2014-04-11 MED ORDER — ONDANSETRON HCL 4 MG PO TABS: 4.0000 mg | ORAL_TABLET | Freq: Four times a day (QID) | ORAL | Status: DC | PRN

## 2014-04-11 MED ORDER — HEPARIN SODIUM (PORCINE) 5000 UNIT/ML IJ SOLN
5000.0000 [IU] | Freq: Three times a day (TID) | INTRAMUSCULAR | Status: DC
  Administered 2014-04-11 – 2014-04-16 (×15): 5000 [IU] via SUBCUTANEOUS
  Filled 2014-04-11 (×17): qty 1

## 2014-04-11 MED ORDER — ONDANSETRON HCL 4 MG/2ML IJ SOLN
4.0000 mg | Freq: Once | INTRAMUSCULAR | Status: AC
  Administered 2014-04-11: 4 mg via INTRAVENOUS
  Filled 2014-04-11: qty 2

## 2014-04-11 NOTE — Progress Notes (Signed)
Called ED for report, nurse to call back.

## 2014-04-11 NOTE — ED Provider Notes (Signed)
CSN: [id number]     Arrival date & time 04/11/14  1640 History   First MD Initiated Contact with Patient 04/11/14 1730     Chief Complaint  Patient presents with  . Recurrent Skin Infections     (Consider location/radiation/quality/duration/timing/severity/associated sxs/prior Treatment) HPI  Jose Jackson is a 43 y.o. male with past medical history significant for diabetes, hypertension, lymphedema and ulceration presenting for evaluation of chronic right lower sternum he also her which is increasing in pain and new discharge starting approximately one week ago. Pain has become very severe in the last 3 days, 8/10, and it prevents him from sleeping at night. No relief with over-the-counter pain medications. Patient denies fever, nausea vomiting, decreased by mouth intake, change in bowel or bladder habits. Patient is noncompliant with any of his medications, noncompliant with followup with primary care. States that there are barriers to his care that included the fact that he doesn't drive, has money issues and has many side effects.    Past Medical History  Diagnosis Date  . Hypertension   . Diabetes mellitus without complication   . 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 Anxiety   . Depression   . Sleep apnea   . Arthritis     KNEES & HIPS   Past Surgical History  Procedure Laterality Date  . Total knee arthroplasty Bilateral   . Hernia repair    . Tonsillectomy     No family history on file. History  Substance Use Topics  . Smoking status: Never Smoker   . Smokeless tobacco: Never Used  . Alcohol Use: No    Review of Systems  10 systems reviewed and found to be negative, except as noted in the HPI.  Allergies  Shellfish allergy  Home Medications   Prior to Admission medications   Medication Sig Start Date End Date Taking? Authorizing Provider  acetaminophen (TYLENOL) 325 MG tablet Take 325 mg by mouth every 6 (six) hours as needed.    Historical Provider, MD   doxycycline (VIBRA-TABS) 100 MG tablet Take 1 tablet (100 mg total) by mouth every 12 (twelve) hours. 01/09/14   Velvet Bathe, MD  metoprolol tartrate (LOPRESSOR) 25 MG tablet Take 1 tablet (25 mg total) by mouth 2 (two) times daily. 01/09/14   Velvet Bathe, MD   BP 146/97  Pulse 108  Temp(Src) 99 F (37.2 C) (Oral)  Resp 16  SpO2 100% Physical Exam  Nursing note and vitals reviewed. Constitutional: He is oriented to person, place, and time. He appears well-developed and well-nourished. No distress.  Obese  HENT:  Head: Normocephalic.  Eyes: Conjunctivae and EOM are normal.  Cardiovascular: Normal rate.   Pulmonary/Chest: Effort normal. No stridor.  Musculoskeletal: Normal range of motion. He exhibits edema.  1+ pitting edema to bilateral lower extremities, right lower extremity with erythema, warmth and tenderness to palpation, there are multiple ulcerations to the anterior side, there is profuse serous sanguinous discharge. Neurovascularly intact.  Neurological: He is alert and oriented to person, place, and time.  Psychiatric: He has a normal mood and affect.    ED Course  Procedures (including critical care time) Labs Review Labs Reviewed - No data to display  Imaging Review No results found.   EKG Interpretation None      MDM   Final diagnoses:  Cellulitis of right lower extremity  Non-compliance    Filed Vitals:   04/11/14 1653 04/11/14 1904 04/11/14 1907  BP: 146/97 119/63   Pulse: 108  109   Temp: 99 F (37.2 C)    TempSrc: Oral    Resp: 16 16   SpO2: 100% 80% 94%    Medications  0.9 %  sodium chloride infusion (not administered)  HYDROmorphone (DILAUDID) injection 1 mg (not administered)  clindamycin (CLEOCIN) IVPB 600 mg (not administered)  ondansetron (ZOFRAN) injection 4 mg (not administered)    Jose Jackson is a 43 y.o. male presenting with cellulitis to right lower extremity with ulceration. Patient has medical noncompliance, did not take  any medication, does not follow with primary care. Patient is afebrile, no white count however he is tachycardic. Patient started on fluids and clindamycin. He will be admitted to Dr. Chancy Milroy for management of cellulitis.  Note: Portions of this report may have been transcribed using voice recognition software. Every effort was made to ensure accuracy; however, inadvertent computerized transcription errors may be present     Monico Blitz, PA-C 04/11/14 1959

## 2014-04-11 NOTE — ED Provider Notes (Signed)
Medical screening examination/treatment/procedure(s) were performed by non-physician practitioner and as supervising physician I was immediately available for consultation/collaboration.    Kathalene Frames, MD 04/11/14 2006

## 2014-04-11 NOTE — ED Notes (Signed)
Per pt, has reoccurring lower extremity cellulitis-right lower extremity ulcer and now thinks it is infected

## 2014-04-11 NOTE — ED Notes (Signed)
Bed: WA21 Expected date:  Expected time:  Means of arrival:  Comments: Hold triage 3

## 2014-04-11 NOTE — Progress Notes (Signed)
  CARE MANAGEMENT ED NOTE 04/11/2014  Patient:  Jose Jackson, Jose Jackson   Account Number:  [id number]  Date Initiated:  04/11/2014  Documentation initiated by:  Jackelyn Poling  Subjective/Objective Assessment:   85 yr od medicare [place] [facility] DM, HTn, recurrent ulcers of calf r/t DM     Subjective/Objective Assessment Detail:   Noted not to be on DM medications States he is suppose to take oral diabetic Reports has not taken medications recently but had a male friend purchase his last medications for him.  States he lost his medicare card  Noted dark right lower extremity  Reports previous pcp Dr Jana Hakim moved and he has not seen any other providers     Action/Plan:   Cm spoke with triage RN, Marzetta Board Cm spoke with pt Cm discussed in network medicare pcps, glucometer, medications, discounted pharmacies. Provided medicare toll free number 1 800 MEDICARE, 1 [phone], Discussed endocrinologists Provided   Action/Plan Detail:   written information for medicare providers, [facility] walk in hours Encouraged to get medicare number from Registration & call medicare for replacement card Discussed wound care providers   Anticipated DC Date:       Status Recommendation to Physician:   Result of Recommendation:    Other ED [place]  Other  PCP issues  Outpatient Services - Pt will follow up    Choice offered to / List presented to:            Status of service:  Completed, signed off  ED Comments:   ED Comments Detail:  Discused local financial assistance resources and provided written information for these resources CM discussed and provided written information for self pay pcps, importance of pcp for f/u care, www.needymeds.org, discounted pharmacies and other [employer] such as financial assistance, DSS and  health department Reviewed resources for [employer] self pay pcps like [facility], family  medicine at [address], [facility] family practice, general medical clinics, [facility] urgent care plus others, [facility] and [facility] Pt voiced understanding and appreciation of resources provided  Encouraged use of [facility] for walk in

## 2014-04-11 NOTE — H&P (Signed)
[facility] History and Physical  Jose Jackson ZWC:[id number] DOB: 05-14-1971 DOA: 04/11/2014  Referring physician: Dorie Rank, MD PCP: No primary provider on file.   Chief Complaint: Cellulitis  HPI: Jose Jackson is a 43 y.o. male who has a history of non-compliance with medical therapy presents to the hospital with a cellulitis. Patient actually states that about a month ago he injured his leg. He noted that the area was somewhat red initially. He did not get any attention for this initially. He states that he has noted increased pain in the leg too. Patient has no real complaints of fevers or chills. Patient has not had aabdominal complaints. Patient is a diabetic and is non-compliant. Patient also has a history of hypertension but currently is controlled. He states has been told he has sleep apnea and he is not on CPAP device.   Review of Systems:  Constitutional:  No weight loss, night sweats, Fevers, chills, fatigue.  HEENT:  No headaches, post nasal drip,  Cardio-vascular:  No chest pain, Orthopnea, PND, ++swelling in lower extremities,  GI:  No heartburn, indigestion, abdominal pain, nausea, vomiting, diarrhea, Resp:  No shortness of breath with exertion or at rest. No coughing up of blood.No wheezing. Skin:  Lesions noted on anterior right lower extremity with erythema and induration GU:  no dysuria, change in color of urine, no urgency or frequency. No flank pain.  Musculoskeletal:  No joint pain or swelling. No decreased range of motion. No back pain.  Psych:  No change in mood or affect. No depression or anxiety. No memory loss.   Past Medical History  Diagnosis Date  . Hypertension   . Diabetes mellitus without complication   . 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 Anxiety   . Depression   . Sleep apnea   . Arthritis     KNEES & HIPS   Past Surgical History  Procedure Laterality Date  . Total knee arthroplasty Bilateral   . Hernia repair    .  Tonsillectomy     Social History:  reports that he has never smoked. He has never used smokeless tobacco. He reports that he does not drink alcohol or use illicit drugs.  Allergies  Allergen Reactions  . Shellfish Allergy Itching    No family history on file.   Prior to Admission medications   Medication Sig Start Date End Date Taking? Authorizing Provider  acetaminophen (TYLENOL) 325 MG tablet Take 650 mg by mouth every 6 (six) hours as needed.    Yes Historical Provider, MD  neomycin-bacitracin-polymyxin (NEOSPORIN) ointment Apply 1 application topically 2 (two) times daily. With bandage change   Yes Historical Provider, MD   Physical Exam: Filed Vitals:   04/11/14 1904  BP: 119/63  Pulse: 109  Temp:   Resp: 16    BP 119/63  Pulse 109  Temp(Src) 99 F (37.2 C) (Oral)  Resp 16  SpO2 94%  General:  Appears calm and comfortable Eyes: PERRL, normal lids, irises & conjunctiva ENT: grossly normal hearing, lips & tongue Neck: no LAD, masses or thyromegaly Cardiovascular: RRR, no m/r/g. ++LE edema.  Respiratory: CTA bilaterally, no w/r/r. Normal respiratory effort. Abdomen: soft, ntnd obese Skin: LE two small lesion with some drainage noted on leg anteriorly Musculoskeletal: grossly normal tone BUE/BLE Psychiatric: grossly normal mood and affect, speech fluent and appropriate Neurologic: grossly non-focal.          Labs on Admission:  Basic Metabolic Panel:  Recent Labs Lab 04/11/14 1806  NA 138  K 4.4  CL 99  CO2 26  GLUCOSE 114*  BUN 16  CREATININE 0.85  CALCIUM 8.7   Liver Function Tests:  Recent Labs Lab 04/11/14 1806  AST 19  ALT 18  ALKPHOS 64  BILITOT 0.9  PROT 7.0  ALBUMIN 3.0*   No results found for this basename: LIPASE, AMYLASE,  in the last 168 hours No results found for this basename: AMMONIA,  in the last 168 hours CBC:  Recent Labs Lab 04/11/14 1806  WBC 7.3  NEUTROABS 3.7  HGB 17.0  HCT 53.9*  MCV 84.2  PLT 227   Cardiac  Enzymes: No results found for this basename: CKTOTAL, CKMB, CKMBINDEX, TROPONINI,  in the last 168 hours  BNP (last 3 results)  Recent Labs  01/08/14 1020  PROBNP 291.9*   CBG: No results found for this basename: GLUCAP,  in the last 168 hours  Radiological Exams on Admission: Dg Tibia/fibula Right  04/11/2014   CLINICAL DATA:  Open wound, infection, cellulitis, diabetes  EXAM: RIGHT TIBIA AND FIBULA - 2 VIEW  COMPARISON:  None.  FINDINGS: Diffuse soft tissue edema noted. Normal alignment without fracture. No acute osseous finding or osseous destruction. No periostitis.  IMPRESSION: Diffuse lower extremity subcutaneous edema. No acute or abnormal osseous finding.   Electronically Signed   By: Daryll Brod M.D.   On: 04/11/2014 18:55     Assessment/Plan Principal Problem:   Cellulitis of right lower extremity Active Problems:   Unspecified essential hypertension   Diabetes mellitus   Sleep apnea   Morbid obesity   Cellulitis of leg, right   1. Cellulitis of the lower extremity -will be started on vancocin and zosyn -will monitor wound closely -consider wound care evaluation  2. Diabetes Mellitus by History -patient states he has diabetes but chooses to not be treated -had a lengthy discussion with patient regarding complications and risks involved with non-compliance -check A1C -carb modified diet -will place on sliding scale coverage  3. Sleep Apnea by History -again non-compliant -will need outpatient workup  4. Morbid Obesity -weight loss discussed -will dietary counseling on discharge  5. Hypertension -currently pressure is controlled -will monitor   Code Status: Full code (must indicate code status--if unknown or must be presumed, indicate so) Family Communication: no family (indicate person spoken with, if applicable, with phone number if by telephone) Disposition Plan: Home (indicate anticipated LOS)  Time spent: 36min  Ray Glacken A Delva Derden [facility] Pager [phone]

## 2014-04-12 DIAGNOSIS — E119 Type 2 diabetes mellitus without complications: Secondary | ICD-10-CM

## 2014-04-12 LAB — COMPREHENSIVE METABOLIC PANEL
ALBUMIN: 3.1 g/dL — AB (ref 3.5–5.2)
ALT: 19 U/L (ref 0–53)
AST: 18 U/L (ref 0–37)
Alkaline Phosphatase: 62 U/L (ref 39–117)
BUN: 19 mg/dL (ref 6–23)
CALCIUM: 8.6 mg/dL (ref 8.4–10.5)
CO2: 30 mEq/L (ref 19–32)
CREATININE: 1.09 mg/dL (ref 0.50–1.35)
Chloride: 99 mEq/L (ref 96–112)
GFR calc Af Amer: >90 mL/min (ref >90)
GFR calc non Af Amer: 82 mL/min — ABNORMAL LOW (ref >90)
Glucose, Bld: 120 mg/dL — ABNORMAL HIGH (ref 70–99)
Potassium: 4.5 mEq/L (ref 3.7–5.3)
Sodium: 139 mEq/L (ref 137–147)
TOTAL PROTEIN: 7 g/dL (ref 6.0–8.3)
Total Bilirubin: 1 mg/dL (ref 0.3–1.2)

## 2014-04-12 LAB — CBC
HEMATOCRIT: 55.8 % — AB (ref 39.0–52.0)
HEMOGLOBIN: 17.3 g/dL — AB (ref 13.0–17.0)
MCH: 27.2 pg (ref 26.0–34.0)
MCHC: 31 g/dL (ref 30.0–36.0)
MCV: 87.6 fL (ref 78.0–100.0)
PLATELETS: 221 10*3/uL (ref 150–400)
RBC: 6.37 MIL/uL — AB (ref 4.22–5.81)
RDW: 15.3 % (ref 11.5–15.5)
WBC: 8 10*3/uL (ref 4.0–10.5)

## 2014-04-12 LAB — GLUCOSE, CAPILLARY
GLUCOSE-CAPILLARY: 157 mg/dL — AB (ref 70–99)
GLUCOSE-CAPILLARY: 89 mg/dL (ref 70–99)
Glucose-Capillary: 120 mg/dL — ABNORMAL HIGH (ref 70–99)
Glucose-Capillary: 126 mg/dL — ABNORMAL HIGH (ref 70–99)

## 2014-04-12 LAB — HEMOGLOBIN A1C
Hgb A1c MFr Bld: 7.6 % — ABNORMAL HIGH (ref <5.7)
Mean Plasma Glucose: 171 mg/dL — ABNORMAL HIGH (ref <117)

## 2014-04-12 MED ORDER — PIPERACILLIN-TAZOBACTAM 3.375 G IVPB
3.3750 g | Freq: Three times a day (TID) | INTRAVENOUS | Status: DC
  Administered 2014-04-12 – 2014-04-15 (×10): 3.375 g via INTRAVENOUS
  Filled 2014-04-12 (×12): qty 50

## 2014-04-12 MED ORDER — VANCOMYCIN HCL 10 G IV SOLR
1500.0000 mg | Freq: Two times a day (BID) | INTRAVENOUS | Status: AC
  Administered 2014-04-12 – 2014-04-13 (×4): 1500 mg via INTRAVENOUS
  Filled 2014-04-12 (×5): qty 1500

## 2014-04-12 MED ORDER — HYDROCODONE-ACETAMINOPHEN 5-325 MG PO TABS
2.0000 | ORAL_TABLET | ORAL | Status: DC | PRN
  Administered 2014-04-12 – 2014-04-16 (×11): 2 via ORAL
  Filled 2014-04-12 (×11): qty 2

## 2014-04-12 NOTE — Progress Notes (Signed)
[facility] PROGRESS NOTE  Jose Jackson EUM:[id number] DOB: 1971-02-10 DOA: 04/11/2014 PCP: No primary provider on file.  Assessment/Plan: 1. Right lower extremity cellulitis: - admitted to med surg . Started on IV vancomycin and zosyn.  - history of non compliance to medications.  - wound care consulted.  - daily dressing changes.   2. Uncontrolled diabetes Mellitus: hgba1c is 7.6. SSI.   3. Sleep apnea: - on cpap.   4. Morbid Obesity -weight loss discussed  -will dietary counseling on discharge  5. Hypertension -currently pressure is controlled  -will monitor  DVT prophylaxis.   Code Status: full code Family Communication: none atbedside Disposition Plan: pending.    Consultants:  WOUND CARE  Procedures:  LE RIGHT  Antibiotics:  Vancomycin   Zosyn 5/13  HPI/Subjective: Pain is better controlled.   Objective: Filed Vitals:   04/12/14 1730  BP: 177/82  Pulse: 98  Temp: 97.9 F (36.6 C)  Resp: 18    Intake/Output Summary (Last 24 hours) at 04/12/14 1901 Last data filed at 04/12/14 1820  Gross per 24 hour  Intake   3690 ml  Output   1900 ml  Net   1790 ml   Filed Weights   04/11/14 2153  Weight: 167.8 kg (369 lb 14.9 oz)    Exam:   General:  Alert afebrile comfortable  Cardiovascular: s1s2  Respiratory: ctab  Abdomen: soft NT nd bs+  Musculoskeletal: bilateral lower extremity 3 + edema.   Right lower extremity swollen more than the LLE, erythematous, warm, tender and 2 ulcerations over the anterior aspect with sero sanguinous drainage, wrapped in bandage.   Data Reviewed: Basic Metabolic Panel:  Recent Labs Lab 04/11/14 1806 04/12/14 0422  NA 138 139  K 4.4 4.5  CL 99 99  CO2 26 30  GLUCOSE 114* 120*  BUN 16 19  CREATININE 0.85 1.09  CALCIUM 8.7 8.6   Liver Function Tests:  Recent Labs Lab 04/11/14 1806 04/12/14 0422  AST 19 18  ALT 18 19  ALKPHOS 64 62  BILITOT 0.9 1.0  PROT 7.0 7.0  ALBUMIN 3.0*  3.1*   No results found for this basename: LIPASE, AMYLASE,  in the last 168 hours No results found for this basename: AMMONIA,  in the last 168 hours CBC:  Recent Labs Lab 04/11/14 1806 04/12/14 0422  WBC 7.3 8.0  NEUTROABS 3.7  --   HGB 17.0 17.3*  HCT 53.9* 55.8*  MCV 84.2 87.6  PLT 227 221   Cardiac Enzymes: No results found for this basename: CKTOTAL, CKMB, CKMBINDEX, TROPONINI,  in the last 168 hours BNP (last 3 results)  Recent Labs  01/08/14 1020  PROBNP 291.9*   CBG:  Recent Labs Lab 04/11/14 2152 04/12/14 0740 04/12/14 1111 04/12/14 1612  GLUCAP 110* 120* 157* 89    Recent Results (from the past 240 hour(s))  WOUND CULTURE     Status: None   Collection Time    04/11/14  6:56 PM      Result Value Ref Range Status   Specimen Description WOUND   Final   Special Requests NONE   Final   Gram Stain     Final   Value: RARE WBC PRESENT,BOTH PMN AND MONONUCLEAR     NO SQUAMOUS EPITHELIAL CELLS SEEN     NO ORGANISMS SEEN     Performed at [facility]   Culture PENDING   Incomplete   Report Status PENDING   Incomplete     Studies: Dg  Tibia/fibula Right  04/11/2014   CLINICAL DATA:  Open wound, infection, cellulitis, diabetes  EXAM: RIGHT TIBIA AND FIBULA - 2 VIEW  COMPARISON:  None.  FINDINGS: Diffuse soft tissue edema noted. Normal alignment without fracture. No acute osseous finding or osseous destruction. No periostitis.  IMPRESSION: Diffuse lower extremity subcutaneous edema. No acute or abnormal osseous finding.   Electronically Signed   By: Daryll Brod M.D.   On: 04/11/2014 18:55    Scheduled Meds: . docusate sodium  100 mg Oral BID  . folic acid  1 mg Oral Daily  . heparin  5,000 Units Subcutaneous 3 times per day  . insulin aspart  0-15 Units Subcutaneous TID WC  . insulin aspart  0-5 Units Subcutaneous QHS  . multivitamin with minerals  1 tablet Oral Daily  . piperacillin-tazobactam (ZOSYN)  IV  3.375 g Intravenous 3 times per day  .  pneumococcal 23 valent vaccine  0.5 mL Intramuscular Tomorrow-1000  . thiamine  100 mg Oral Daily  . vancomycin  1,500 mg Intravenous Q12H   Continuous Infusions: . sodium chloride 50 mL/hr at 04/11/14 2223    Principal Problem:   Cellulitis of right lower extremity Active Problems:   Unspecified essential hypertension   Diabetes mellitus   Sleep apnea   Morbid obesity   Cellulitis of leg, right    Time spent: 35 minutes.     Hosie Poisson  [facility] Pager [phone]. If 7PM-7AM, please contact night-coverage at [email or web address], password [facility] 04/12/2014, 7:01 PM  LOS: 1 day

## 2014-04-12 NOTE — Progress Notes (Signed)
Clinical Social Work  CSW received inappropriate referral to assist with medication assistance. CM aware of needs. CSW is signing off but available if further needs arise.  Sindy Messing, LCSW  (Coverage for [employer])

## 2014-04-12 NOTE — Care Management Note (Signed)
    Page 1 of 2   04/13/2014     3:09:56 PM CARE MANAGEMENT NOTE 04/13/2014  Patient:  Jose Jackson, Jose Jackson   Account Number:  [id number]  Date Initiated:  04/12/2014  Documentation initiated by:  [name]  Subjective/Objective Assessment:   43 yo male admitted with LE cellulitis. PTA lived at home alone.     Action/Plan:   Home when stable   Anticipated DC Date:  04/14/2014   Anticipated DC Plan:  [place]  CM consult  PCP issues      Choice offered to / List presented to:             Status of service:  Completed, signed off Medicare Important Message given?   (If response is "NO", the following Medicare IM given date fields will be blank) Date Medicare IM given:   Date Additional Medicare IM given:    Discharge Disposition:  HOME/SELF CARE  Per UR Regulation:  Reviewed for med. necessity/level of care/duration of stay  If discussed at [place] of Stay Meetings, dates discussed:    Comments:  04-13-14 [place] 1400 Spoke with patient again today. States he would like to establish with the [facility]. Attempted to make appt but unable to get through, left message and sent email requesting appt. Will likely need [facility] RN for wound care. Patient would like to use [facility] for [facility] services, notified [facility] of referral, awaiting final order.  04-12-14 [name] RN CM 1100 Spoke with patient at bedside. Followed up on information provided by ED CM. Patient states he has medication coverage with Medicare but was unaware of it. He wants to get a PCP close to his home, discussed programs offered through [facility]. He appears overwhelmed by his situation and states his wife used to take care of everything, he is now divorced. He states he has little support here and he has family in [place] who have encouraged him to come there to live so he will have more support. Per Epic patient has been provided similar information on previous  admissions and even had [facility] liason provide resources and contact information for f/u after his last admission.  (Filed: 01/08/2014  1:06 PM Note Time: 01/08/2014  1:02 PM Status: Signed   Editor: Saintclair Halsted   [id number] Jose Jackson, Community Liaison Spoke to patient about primary care resources. Patient stated that he has medicare but was displaced after his pcp relocated. Medicare pcp list given as well as other resource guides. Patient was also given my contact information for future questions or concerns.)

## 2014-04-12 NOTE — Progress Notes (Signed)
UR completed. Patient changed to inpatient- requiring IV antibiotics and IVF

## 2014-04-12 NOTE — Discharge Planning (Signed)
Jose Jackson    Spoke to Jose Jackson at [employer] about a prior encounter with Jose Jackson at [facility] ED.  I was Informed that the pt is currently at [facility] hospital, and has not followed up with the resources he was given. I spoke with patient via the phone to discuss primary care options. Patient stated that he has not followed up but would be willing.  Patient expressed concerns of having a private doctor. Patient was informed that a list of primary care providers who accept his Medicare could be provided. Spoke to [employer] and [name] about providing the information for the patient. No other needs expressed at this time.

## 2014-04-12 NOTE — Progress Notes (Signed)
ANTIBIOTIC CONSULT NOTE - INITIAL  Pharmacy Consult for Vancomycin and Zosyn  Indication: Cellulitis  Allergies  Allergen Reactions  . Shellfish Allergy Itching    Patient Measurements: Height: 6\' 3"  (190.5 cm) Weight: 369 lb 14.9 oz (167.8 kg) IBW/kg (Calculated) : 84.5 Adjusted Body Weight:   Vital Signs: Temp: 97.6 F (36.4 C) (05/14 0202) Temp src: Oral (05/14 0202) BP: 127/79 mmHg (05/14 0202) Pulse Rate: 104 (05/14 0202) Intake/Output from previous day: 05/13 0701 - 05/14 0700 In: 1270 [P.O.:720; IV Piggyback:550] Out: 600 [Urine:600] Intake/Output from this shift: Total I/O In: 1270 [P.O.:720; IV Piggyback:550] Out: 600 [Urine:600]  Labs:  Recent Labs  04/11/14 1806  WBC 7.3  HGB 17.0  PLT 227  CREATININE 0.85   Estimated Creatinine Clearance: 188.6 ml/min (by C-G formula based on Cr of 0.85). No results found for this basename: VANCOTROUGH, VANCOPEAK, VANCORANDOM, GENTTROUGH, GENTPEAK, GENTRANDOM, TOBRATROUGH, TOBRAPEAK, TOBRARND, AMIKACINPEAK, AMIKACINTROU, AMIKACIN,  in the last 72 hours   Microbiology: No results found for this or any previous visit (from the past 720 hour(s)).  Medical History: Past Medical History  Diagnosis Date  . Hypertension   . Diabetes mellitus without complication   . 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 Anxiety   . Depression   . Sleep apnea   . Arthritis     KNEES & HIPS    Medications:  Anti-infectives   Start     Dose/Rate Route Frequency Ordered Stop   04/12/14 1000  vancomycin (VANCOCIN) 1,500 mg in sodium chloride 0.9 % 500 mL IVPB     1,500 mg 250 mL/hr over 120 Minutes Intravenous Every 12 hours 04/12/14 0329     04/12/14 0600  piperacillin-tazobactam (ZOSYN) IVPB 3.375 g     3.375 g 12.5 mL/hr over 240 Minutes Intravenous 3 times per day 04/12/14 0329     04/11/14 2215  vancomycin (VANCOCIN) 2,500 mg in sodium chloride 0.9 % 500 mL IVPB     2,500 mg 250 mL/hr over 120 Minutes Intravenous  Once 04/11/14  2205 04/12/14 0022   04/11/14 2215  piperacillin-tazobactam (ZOSYN) IVPB 3.375 g     3.375 g 100 mL/hr over 30 Minutes Intravenous  Once 04/11/14 2205 04/11/14 2252   04/11/14 1745  clindamycin (CLEOCIN) IVPB 600 mg     600 mg 100 mL/hr over 30 Minutes Intravenous  Once 04/11/14 1742 04/11/14 1848     Assessment: Patient with cellulitis. First dose of antibiotics already given.  Goal of Therapy:  Vancomycin trough level 10-15 mcg/ml Zosyn based on renal function   Plan:  Measure antibiotic drug levels at steady state Follow up culture results Vancomycin 1500mg  iv q12hr Zosyn 3.375g IV Q8H infused over 4hrs.   Shea Stakes [place]. 04/12/2014,3:29 AM

## 2014-04-13 DIAGNOSIS — R609 Edema, unspecified: Secondary | ICD-10-CM

## 2014-04-13 DIAGNOSIS — L03119 Cellulitis of unspecified part of limb: Secondary | ICD-10-CM

## 2014-04-13 DIAGNOSIS — L02419 Cutaneous abscess of limb, unspecified: Secondary | ICD-10-CM

## 2014-04-13 LAB — VANCOMYCIN, TROUGH: VANCOMYCIN TR: 7.3 ug/mL — AB (ref 10.0–20.0)

## 2014-04-13 LAB — GLUCOSE, CAPILLARY
GLUCOSE-CAPILLARY: 125 mg/dL — AB (ref 70–99)
GLUCOSE-CAPILLARY: 98 mg/dL (ref 70–99)
Glucose-Capillary: 129 mg/dL — ABNORMAL HIGH (ref 70–99)
Glucose-Capillary: 97 mg/dL (ref 70–99)

## 2014-04-13 LAB — CREATININE, SERUM
Creatinine, Ser: 1 mg/dL (ref 0.50–1.35)
GFR calc Af Amer: >90 mL/min (ref >90)
GFR calc non Af Amer: >90 mL/min (ref >90)

## 2014-04-13 MED ORDER — VANCOMYCIN HCL 10 G IV SOLR
1500.0000 mg | Freq: Three times a day (TID) | INTRAVENOUS | Status: DC
  Administered 2014-04-14 – 2014-04-15 (×4): 1500 mg via INTRAVENOUS
  Filled 2014-04-13 (×5): qty 1500

## 2014-04-13 MED ORDER — FUROSEMIDE 40 MG PO TABS
40.0000 mg | ORAL_TABLET | Freq: Every day | ORAL | Status: DC
  Administered 2014-04-13 – 2014-04-16 (×4): 40 mg via ORAL
  Filled 2014-04-13 (×4): qty 1

## 2014-04-13 MED ORDER — METFORMIN HCL 500 MG PO TABS
500.0000 mg | ORAL_TABLET | Freq: Two times a day (BID) | ORAL | Status: DC
  Administered 2014-04-13 – 2014-04-16 (×7): 500 mg via ORAL
  Filled 2014-04-13 (×8): qty 1

## 2014-04-13 NOTE — Progress Notes (Signed)
ANTIBIOTIC CONSULT NOTE - INITIAL  Pharmacy Consult for Vancomycin and Zosyn  Indication: Cellulitis  Allergies  Allergen Reactions  . Shellfish Allergy Itching    Patient Measurements: Height: 6\' 3"  (190.5 cm) Weight: 369 lb 14.9 oz (167.8 kg) IBW/kg (Calculated) : 84.5 Adjusted Body Weight:   Vital Signs: Temp: 98.4 F (36.9 C) (05/15 0509) Temp src: Oral (05/15 0509) BP: 132/88 mmHg (05/15 0509) Pulse Rate: 95 (05/15 0509) Intake/Output from previous day: 05/14 0701 - 05/15 0700 In: 1810.8 [P.O.:720; I.V.:540.8; IV Piggyback:550] Out: 1500 [Urine:1500] Intake/Output from this shift: Total I/O In: -  Out: 200 [Urine:200]  Labs:  Recent Labs  04/11/14 1806 04/12/14 0422 04/13/14 0345  WBC 7.3 8.0  --   HGB 17.0 17.3*  --   PLT 227 221  --   CREATININE 0.85 1.09 1.00   Estimated Creatinine Clearance: 160.3 ml/min (by C-G formula based on Cr of 1). No results found for this basename: VANCOTROUGH, VANCOPEAK, VANCORANDOM, GENTTROUGH, GENTPEAK, GENTRANDOM, TOBRATROUGH, TOBRAPEAK, TOBRARND, AMIKACINPEAK, AMIKACINTROU, AMIKACIN,  in the last 72 hours   Microbiology: Recent Results (from the past 720 hour(s))  CULTURE, BLOOD (ROUTINE X 2)     Status: None   Collection Time    04/11/14  5:57 PM      Result Value Ref Range Status   Specimen Description BLOOD LEFT HAND   Final   Special Requests BOTTLES DRAWN AEROBIC AND ANAEROBIC 5ML   Final   Culture  Setup Time     Final   Value: 04/12/2014 01:05     Performed at [facility]   Culture     Final   Value:        BLOOD CULTURE RECEIVED NO GROWTH TO DATE CULTURE WILL BE HELD FOR 5 DAYS BEFORE ISSUING A FINAL NEGATIVE REPORT     Performed at [facility]   Report Status PENDING   Incomplete  CULTURE, BLOOD (ROUTINE X 2)     Status: None   Collection Time    04/11/14  6:03 PM      Result Value Ref Range Status   Specimen Description BLOOD RIGHT HAND   Final   Special Requests BOTTLES DRAWN AEROBIC  AND ANAEROBIC 5ML   Final   Culture  Setup Time     Final   Value: 04/12/2014 01:05     Performed at [facility]   Culture     Final   Value:        BLOOD CULTURE RECEIVED NO GROWTH TO DATE CULTURE WILL BE HELD FOR 5 DAYS BEFORE ISSUING A FINAL NEGATIVE REPORT     Performed at [facility]   Report Status PENDING   Incomplete  WOUND CULTURE     Status: None   Collection Time    04/11/14  6:56 PM      Result Value Ref Range Status   Specimen Description WOUND   Final   Special Requests NONE   Final   Gram Stain     Final   Value: RARE WBC PRESENT,BOTH PMN AND MONONUCLEAR     NO SQUAMOUS EPITHELIAL CELLS SEEN     NO ORGANISMS SEEN     Performed at [facility]   Culture PENDING   Incomplete   Report Status PENDING   Incomplete    Medical History: Past Medical History  Diagnosis Date  . Hypertension   . Diabetes mellitus without complication   . 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  Anxiety   . Depression   . Sleep apnea   . Arthritis     KNEES & HIPS    Medications:  Anti-infectives   Start     Dose/Rate Route Frequency Ordered Stop   04/12/14 1000  vancomycin (VANCOCIN) 1,500 mg in sodium chloride 0.9 % 500 mL IVPB     1,500 mg 250 mL/hr over 120 Minutes Intravenous Every 12 hours 04/12/14 0329     04/12/14 0600  piperacillin-tazobactam (ZOSYN) IVPB 3.375 g     3.375 g 12.5 mL/hr over 240 Minutes Intravenous 3 times per day 04/12/14 0329     04/11/14 2215  vancomycin (VANCOCIN) 2,500 mg in sodium chloride 0.9 % 500 mL IVPB     2,500 mg 250 mL/hr over 120 Minutes Intravenous  Once 04/11/14 2205 04/12/14 0022   04/11/14 2215  piperacillin-tazobactam (ZOSYN) IVPB 3.375 g     3.375 g 100 mL/hr over 30 Minutes Intravenous  Once 04/11/14 2205 04/11/14 2252   04/11/14 1745  clindamycin (CLEOCIN) IVPB 600 mg     600 mg 100 mL/hr over 30 Minutes Intravenous  Once 04/11/14 1742 04/11/14 1848     Assessment: 64 yom presented to hospital with  cellulitis after injuring leg a month ago. Pt has h/o DM (non-compliant), HTN, sleep apnea. Pharmacy consulted to dose Zosyn and Vancomycin for cellulitis.   5/13 >> vanc >> 5/13>> zosyn >>  Tmax: AF WBCs: 8.0 (5/14) Renal: SCr 1.09 - minimal rise; CrCl 90 N  5/13 Blood x 2: NGtd 5/13 Wound R leg: pending  Dose changes/drug level info:  5/15 VT 2100: ____   Goal of Therapy:  Vancomycin trough level 10-15 mcg/ml Zosyn based on renal function   Plan:  Continue Vancomycin 1500mg  IV Q12H and Zosyn 3.375g IV Q8H. Follow up on Vancomycin trough today at 2100 Follow c/s  Julio Sicks 04/13/2014,10:05 AM

## 2014-04-13 NOTE — Progress Notes (Signed)
Placed pt on Auto titrate BiPAP (Min EPAP-5cmH2O & Max IPAP-20cmH2O) with 2L of oxygen bled in. Sterile water was added for humidification. Pt states he has high anxiety and may not be able to tolerate BiPAP for the entire night. Pt was made aware if he should need any assistance to contact RT. RT will continue to monitor as needed.

## 2014-04-13 NOTE — Progress Notes (Signed)
Right lower extremity venous duplex completed.  Right:  No evidence of DVT, superficial thrombosis, or Baker's cyst.  Left:  Negative for DVT in the common femoral vein.  

## 2014-04-13 NOTE — Progress Notes (Signed)
PHARMACY BRIEF NOTE - Drug Level Result  Consult for:  Vancomycin Indication:  Cellulitis  The current Vancomycin dose is 1500 mg IV every 12 hours.  The trough level drawn prior to the 10 pm dose tonight is reported as 7.3 mcg/ml.  This level is below the therapeutic range, 10-15 mcg/ml.  Plan: Increase the Vancomycin dose to 1500 mg IV every 8 hours.  [place] 04/13/2014 10:21 PM

## 2014-04-13 NOTE — Consult Note (Signed)
WOC wound consult note Reason for Consult:Cellulits to Right lower leg.  Bilateral edema in lower extremities.   Wound type:Inflammatory/cellulitis to right lower leg.  Measurement: 2 ulcers- proxima:  1 cm x 1 cm x 0.1 cm, distal 2.5 cm x 3 cm x 0.2 cm Wound bed:100% ruddy red. Drainage (amount, consistency, odor) Heavy, serous drainage. Continuously dripping from wound bed. 1+ pitting edema in feet and extends up legs.  Generelized edema in left lower leg.  Patient states that both legs have been swelling more lately.  Periwound:Intact.  Dressing procedure/placement/frequency:Cleanse ulcers to right lower leg with NS and pat gently dry.  Apply Aquacel Ag to wound bed, top with ABD pads, kerlix and ace wrap. Change every other day and PRN drainage strikethrough.   Will follow up Monday, if drainage has slowed, will consider bilateral UNNAs boots.  [place] team will continue to follow and remain available to patient, nursing and medical teams.  Domenic Moras RN BSN [place] Pager [phone]

## 2014-04-13 NOTE — Clinical Documentation Improvement (Signed)
Possible Clinical Conditions?   _______Diabetes Type   or 2 _______Controlled or uncontrolled  Manifestations:  _______DM retinopathy  _______DM PVD _______DM neuropathy   _______DM nephropathy  Associated conditions: _______DM cellulitis  _______DM skin ulcer  _______Other Condition _______Cannot Clinically determine   Supporting Information: Per  04/11/14 H&P :  Diabetes mellitus without complication.  Patient states he has diabetes but chooses to not be treated.     Thank You, Serena Colonel ,RN Clinical Documentation Specialist:  [place] [employer]

## 2014-04-13 NOTE — Progress Notes (Signed)
[facility] PROGRESS NOTE  Jose Jackson NWG:[id number] DOB: 1971-10-10 DOA: 04/11/2014 PCP: No primary provider on file. Interim summary: Jose Jackson is a 43 y.o. male who has a history of non-compliance with medical therapy presents to the hospital with a cellulitis. Patient actually states that about a month ago he injured his leg. He noted that the area was somewhat red initially. He did not get any attention for this initially. He states that he has noted increased pain in the leg too. Patient has no real complaints of fevers or chills. Patient has not had aabdominal complaints. Patient is a diabetic and is non-compliant. Patient also has a history of hypertension but currently is controlled. He states has been told he has sleep apnea and he is not on CPAP device. He was admitted to [facility] service for further evaluation of the RLE cellulitis and diabetic ulcer. He was started on IV antibiotics and venous duplex ordered. Wound care consulted.   Assessment/Plan: 1. Right lower extremity cellulitis: - admitted to med surg . Started on IV vancomycin and zosyn day 3.   - history of non compliance to medications.  - wound care consulted.  - daily dressing changes.   2. Uncontrolled diabetes Mellitus: hgba1c is 7.6. SSI.  Started patient on metformin.   3. Sleep apnea: - on cpap.   4. Morbid Obesity -weight loss discussed  -will dietary counseling on discharge  5. Hypertension -currently pressure is controlled  -will monitor 6. Lower extremity edema: - echo done in 2015 feb shows good LVEF, with no regional wall motion abnormalities. No diastolic dysfunction.  Lasix ordered for lower extremity edema.   DVT prophylaxis.   Code Status: full code Family Communication: none atbedside Disposition Plan: pending, possibly home in 2 days.    Consultants:  WOUND CARE  Procedures:  LE RIGHT x ray  Venous duplex.   Antibiotics:  Vancomycin   Zosyn  5/13  HPI/Subjective: Pain is better controlled.   Objective: Filed Vitals:   04/13/14 1419  BP: 117/76  Pulse: 105  Temp: 97.7 F (36.5 C)  Resp: 18    Intake/Output Summary (Last 24 hours) at 04/13/14 1437 Last data filed at 04/13/14 1334  Gross per 24 hour  Intake    920 ml  Output   1700 ml  Net   -780 ml   Filed Weights   04/11/14 2153  Weight: 167.8 kg (369 lb 14.9 oz)    Exam:   General:  Alert afebrile comfortable  Cardiovascular: s1s2  Respiratory: ctab  Abdomen: soft NT nd bs+  Musculoskeletal: bilateral lower extremity 3 + edema.   Right lower extremity swollen more than the LLE, erythematous, warm, tender and 2 ulcerations over the anterior aspect with sero sanguinous drainage, wrapped in bandage.   Data Reviewed: Basic Metabolic Panel:  Recent Labs Lab 04/11/14 1806 04/12/14 0422 04/13/14 0345  NA 138 139  --   K 4.4 4.5  --   CL 99 99  --   CO2 26 30  --   GLUCOSE 114* 120*  --   BUN 16 19  --   CREATININE 0.85 1.09 1.00  CALCIUM 8.7 8.6  --    Liver Function Tests:  Recent Labs Lab 04/11/14 1806 04/12/14 0422  AST 19 18  ALT 18 19  ALKPHOS 64 62  BILITOT 0.9 1.0  PROT 7.0 7.0  ALBUMIN 3.0* 3.1*   No results found for this basename: LIPASE, AMYLASE,  in the last 168 hours No results  found for this basename: AMMONIA,  in the last 168 hours CBC:  Recent Labs Lab 04/11/14 1806 04/12/14 0422  WBC 7.3 8.0  NEUTROABS 3.7  --   HGB 17.0 17.3*  HCT 53.9* 55.8*  MCV 84.2 87.6  PLT 227 221   Cardiac Enzymes: No results found for this basename: CKTOTAL, CKMB, CKMBINDEX, TROPONINI,  in the last 168 hours BNP (last 3 results)  Recent Labs  01/08/14 1020  PROBNP 291.9*   CBG:  Recent Labs Lab 04/12/14 1111 04/12/14 1612 04/12/14 2114 04/13/14 0708 04/13/14 1149  GLUCAP 157* 89 126* 129* 125*    Recent Results (from the past 240 hour(s))  CULTURE, BLOOD (ROUTINE X 2)     Status: None   Collection Time     04/11/14  5:57 PM      Result Value Ref Range Status   Specimen Description BLOOD LEFT HAND   Final   Special Requests BOTTLES DRAWN AEROBIC AND ANAEROBIC 5ML   Final   Culture  Setup Time     Final   Value: 04/12/2014 01:05     Performed at [facility]   Culture     Final   Value:        BLOOD CULTURE RECEIVED NO GROWTH TO DATE CULTURE WILL BE HELD FOR 5 DAYS BEFORE ISSUING A FINAL NEGATIVE REPORT     Performed at [facility]   Report Status PENDING   Incomplete  CULTURE, BLOOD (ROUTINE X 2)     Status: None   Collection Time    04/11/14  6:03 PM      Result Value Ref Range Status   Specimen Description BLOOD RIGHT HAND   Final   Special Requests BOTTLES DRAWN AEROBIC AND ANAEROBIC 5ML   Final   Culture  Setup Time     Final   Value: 04/12/2014 01:05     Performed at [facility]   Culture     Final   Value:        BLOOD CULTURE RECEIVED NO GROWTH TO DATE CULTURE WILL BE HELD FOR 5 DAYS BEFORE ISSUING A FINAL NEGATIVE REPORT     Performed at [facility]   Report Status PENDING   Incomplete  WOUND CULTURE     Status: None   Collection Time    04/11/14  6:56 PM      Result Value Ref Range Status   Specimen Description WOUND   Final   Special Requests NONE   Final   Gram Stain     Final   Value: RARE WBC PRESENT,BOTH PMN AND MONONUCLEAR     NO SQUAMOUS EPITHELIAL CELLS SEEN     NO ORGANISMS SEEN     Performed at [facility]   Culture PENDING   Incomplete   Report Status PENDING   Incomplete     Studies: Dg Tibia/fibula Right  04/11/2014   CLINICAL DATA:  Open wound, infection, cellulitis, diabetes  EXAM: RIGHT TIBIA AND FIBULA - 2 VIEW  COMPARISON:  None.  FINDINGS: Diffuse soft tissue edema noted. Normal alignment without fracture. No acute osseous finding or osseous destruction. No periostitis.  IMPRESSION: Diffuse lower extremity subcutaneous edema. No acute or abnormal osseous finding.   Electronically Signed   By: Daryll Brod  M.D.   On: 04/11/2014 18:55    Scheduled Meds: . docusate sodium  100 mg Oral BID  . folic acid  1 mg Oral Daily  . furosemide  40  mg Oral Daily  . heparin  5,000 Units Subcutaneous 3 times per day  . insulin aspart  0-15 Units Subcutaneous TID WC  . insulin aspart  0-5 Units Subcutaneous QHS  . metFORMIN  500 mg Oral BID WC  . multivitamin with minerals  1 tablet Oral Daily  . piperacillin-tazobactam (ZOSYN)  IV  3.375 g Intravenous 3 times per day  . pneumococcal 23 valent vaccine  0.5 mL Intramuscular Tomorrow-1000  . thiamine  100 mg Oral Daily  . vancomycin  1,500 mg Intravenous Q12H   Continuous Infusions:    Principal Problem:   Cellulitis of right lower extremity Active Problems:   Unspecified essential hypertension   Diabetes mellitus   Sleep apnea   Morbid obesity   Cellulitis of leg, right    Time spent: 35 minutes.     Hosie Poisson  [facility] Pager [phone]. If 7PM-7AM, please contact night-coverage at [email or web address], password [facility] 04/13/2014, 2:37 PM  LOS: 2 days

## 2014-04-14 LAB — GLUCOSE, CAPILLARY
GLUCOSE-CAPILLARY: 110 mg/dL — AB (ref 70–99)
GLUCOSE-CAPILLARY: 93 mg/dL (ref 70–99)

## 2014-04-14 NOTE — Progress Notes (Signed)
Pt states he is not ready to wear qhs BiPAP at this time. RT informed pt to contact RT once he is ready.

## 2014-04-14 NOTE — Progress Notes (Signed)
Dr. Karleen Hampshire aware of elevated BP's this afternoon. Latest 135/92 with HR 110. Pt asymptomatic reporting "My pressure runs up sometimes when I don't sleep well." No new orders received at this time.

## 2014-04-14 NOTE — Evaluation (Signed)
Physical Therapy One Time Evaluation Patient Details Name: Jose Jackson MRN: [id number] DOB: 07-04-1971 Today's Date: 04/14/2014   History of Present Illness  Pt is a 43 year old male admitted 5/13 for R LE cellulitis and PMHx of sleep apnea, DM, diabetic ulcer, HTN, and arthritis.  Negative for DVT and negative tib/fib xray.  Clinical Impression  Patient evaluated by Physical Therapy with no further acute PT needs identified. All education has been completed and the patient has no further questions. Pt educated on safe use of RW and recommend RW for home to assist with mobility and pain control. Pt reports his ambulation is at baseline and agreeable to walk with nsg staff during acute stay.  Pt reports he can manage his steps at home to bed/bath upstairs and did not need to practice.  Verbally reviewed safe stair technique (pt reports usually sideways with rail, educated up with "good", down with the "bad" which pt recalls from previous TKRs). PT is signing off. Thank you for this referral.     Follow Up Recommendations No PT follow up    Equipment Recommendations  Rolling walker with 5" wheels (wide reinforced)    Recommendations for Other Services       Precautions / Restrictions Precautions Precautions: None      Mobility  Bed Mobility               General bed mobility comments: not assessed  Transfers Overall transfer level: Needs assistance Equipment used: Rolling walker (2 wheeled) Transfers: Sit to/from Stand Sit to Stand: Min guard;Supervision         General transfer comment: verbal cues for safe technique with RW, wide BOS  Ambulation/Gait Ambulation/Gait assistance: Supervision;Min guard Ambulation Distance (Feet): 400 Feet Assistive device: Rolling walker (2 wheeled) Gait Pattern/deviations: Wide base of support;Step-through pattern;Decreased stride length     General Gait Details: verbal cues for safe use of RW, use of UE through RW to assist  with pain control for R LE, a few standing rest breaks required due to UE fatigue  Stairs            Wheelchair Mobility    Modified Rankin (Stroke Patients Only)       Balance                                             Pertinent Vitals/Pain 7/10 R lower leg "good" for pt (prior to admission states he was always above a 10/10), reports premedicated, activity to tolerance    Home Living Family/patient expects to be discharged to:: Private residence Living Arrangements: Alone   Type of Home: House Home Access: Stairs to enter     Home Layout: Two level;Bed/bath upstairs Home Equipment: None Additional Comments: Pt reports he lives in town home and only bath is upstairs.    Prior Function Level of Independence: Independent         Comments: however reports borrowing friend's rollator for community at times     Hand Dominance        Extremity/Trunk Assessment               Lower Extremity Assessment: Overall WFL for tasks assessed (no weakness per pt, body habitus limits some ROM but WFL for mobility)         Communication   Communication: No difficulties  Cognition Arousal/Alertness: Awake/alert Behavior During  Therapy: WFL for tasks assessed/performed Overall Cognitive Status: Within Functional Limits for tasks assessed                      General Comments      Exercises        Assessment/Plan    PT Assessment Patent does not need any further PT services  PT Diagnosis     PT Problem List    PT Treatment Interventions     PT Goals (Current goals can be found in the Care Plan section) Acute Rehab PT Goals PT Goal Formulation: No goals set, d/c therapy    Frequency     Barriers to discharge        Co-evaluation               End of Session   Activity Tolerance: Patient tolerated treatment well Patient left: in chair;with call bell/phone within reach;with nursing/sitter in room (pt has been  sleeping/sitting window seat area) Nurse Communication: Mobility status         Time: 1315-1330 PT Time Calculation (min): 15 min   Charges:   PT Evaluation $Initial PT Evaluation Tier I: 1 Procedure PT Treatments $Gait Training: 8-22 mins   PT G CodesJunius Argyle 04/14/2014, 1:46 PM Carmelia Bake, PT, DPT 04/14/2014 Pager: [phone]

## 2014-04-14 NOTE — Progress Notes (Signed)
[facility] PROGRESS NOTE  Jose Jackson KVQ:[id number] DOB: 1971/04/01 DOA: 04/11/2014 PCP: No primary provider on file. Interim summary: Jose Jackson is a 43 y.o. male who has a history of non-compliance with medical therapy presents to the hospital with a cellulitis. Patient actually states that about a month ago he injured his leg. He noted that the area was somewhat red initially. He did not get any attention for this initially. He states that he has noted increased pain in the leg too. Patient has no real complaints of fevers or chills. Patient has not had aabdominal complaints. Patient is a diabetic and is non-compliant. Patient also has a history of hypertension but currently is controlled. He states has been told he has sleep apnea and he is not on CPAP device. He was admitted to [facility] service for further evaluation of the RLE cellulitis and diabetic ulcer. He was started on IV antibiotics and venous duplex ordered. Venous duplex negative for DVT. He was also started on lasix for diuresis. He has an echo done in 2/15 showed good LVEF , no regional wall motion abnormalities, right ventricle and right atrium dilated possibly from sleep apnea. Wound care consulted and recommendations given.   Assessment/Plan: 1. Right lower extremity cellulitis: - admitted to med surg . Started on IV vancomycin and zosyn day 4.   - history of non compliance to medications.  - wound care consulted.  - daily dressing changes.   2. Uncontrolled diabetes Mellitus: hgba1c is 7.6. SSI.  Started patient on metformin.  CBG (last 3)   Recent Labs  04/13/14 2224 04/14/14 0751 04/14/14 1210  GLUCAP 97 110* 93      3. Sleep apnea: - on cpap.   4. Morbid Obesity -weight loss discussed  -will dietary counseling on discharge  5. Hypertension -currently pressure is controlled  -will monitor 6. Lower extremity edema: - echo done in 2015 feb shows good LVEF, with no regional wall motion abnormalities.  No diastolic dysfunction.  Lasix ordered for lower extremity edema.   DVT prophylaxis.   Code Status: full code Family Communication: none atbedside Disposition Plan: pending, possibly home in 1 days.    Consultants:  WOUND CARE  Physical therapy  Procedures:  LE RIGHT x ray  Venous duplex.   Antibiotics:  Vancomycin   Zosyn 5/13  HPI/Subjective: Pain is better controlled.   Objective: Filed Vitals:   04/14/14 0550  BP: 128/88  Pulse: 104  Temp: 97.8 F (36.6 C)  Resp: 18    Intake/Output Summary (Last 24 hours) at 04/14/14 1425 Last data filed at 04/14/14 1000  Gross per 24 hour  Intake    240 ml  Output   1870 ml  Net  -1630 ml   Filed Weights   04/11/14 2153  Weight: 167.8 kg (369 lb 14.9 oz)    Exam:   General:  Alert afebrile comfortable  Cardiovascular: s1s2  Respiratory: ctab  Abdomen: soft NT nd bs+  Musculoskeletal: bilateral lower extremity 3 + edema.   Right lower extremity swollen more than the LLE, erythematous, warm, tender and 2 ulcerations over the anterior aspect with sero sanguinous drainage, wrapped in bandage.   Data Reviewed: Basic Metabolic Panel:  Recent Labs Lab 04/11/14 1806 04/12/14 0422 04/13/14 0345  NA 138 139  --   K 4.4 4.5  --   CL 99 99  --   CO2 26 30  --   GLUCOSE 114* 120*  --   BUN 16 19  --  CREATININE 0.85 1.09 1.00  CALCIUM 8.7 8.6  --    Liver Function Tests:  Recent Labs Lab 04/11/14 1806 04/12/14 0422  AST 19 18  ALT 18 19  ALKPHOS 64 62  BILITOT 0.9 1.0  PROT 7.0 7.0  ALBUMIN 3.0* 3.1*   No results found for this basename: LIPASE, AMYLASE,  in the last 168 hours No results found for this basename: AMMONIA,  in the last 168 hours CBC:  Recent Labs Lab 04/11/14 1806 04/12/14 0422  WBC 7.3 8.0  NEUTROABS 3.7  --   HGB 17.0 17.3*  HCT 53.9* 55.8*  MCV 84.2 87.6  PLT 227 221   Cardiac Enzymes: No results found for this basename: CKTOTAL, CKMB, CKMBINDEX, TROPONINI,   in the last 168 hours BNP (last 3 results)  Recent Labs  01/08/14 1020  PROBNP 291.9*   CBG:  Recent Labs Lab 04/13/14 1149 04/13/14 1638 04/13/14 2224 04/14/14 0751 04/14/14 1210  GLUCAP 125* 98 97 110* 93    Recent Results (from the past 240 hour(s))  CULTURE, BLOOD (ROUTINE X 2)     Status: None   Collection Time    04/11/14  5:57 PM      Result Value Ref Range Status   Specimen Description BLOOD LEFT HAND   Final   Special Requests BOTTLES DRAWN AEROBIC AND ANAEROBIC 5ML   Final   Culture  Setup Time     Final   Value: 04/12/2014 01:05     Performed at [facility]   Culture     Final   Value:        BLOOD CULTURE RECEIVED NO GROWTH TO DATE CULTURE WILL BE HELD FOR 5 DAYS BEFORE ISSUING A FINAL NEGATIVE REPORT     Performed at [facility]   Report Status PENDING   Incomplete  CULTURE, BLOOD (ROUTINE X 2)     Status: None   Collection Time    04/11/14  6:03 PM      Result Value Ref Range Status   Specimen Description BLOOD RIGHT HAND   Final   Special Requests BOTTLES DRAWN AEROBIC AND ANAEROBIC 5ML   Final   Culture  Setup Time     Final   Value: 04/12/2014 01:05     Performed at [facility]   Culture     Final   Value:        BLOOD CULTURE RECEIVED NO GROWTH TO DATE CULTURE WILL BE HELD FOR 5 DAYS BEFORE ISSUING A FINAL NEGATIVE REPORT     Performed at [facility]   Report Status PENDING   Incomplete  WOUND CULTURE     Status: None   Collection Time    04/11/14  6:56 PM      Result Value Ref Range Status   Specimen Description WOUND   Final   Special Requests NONE   Final   Gram Stain     Final   Value: RARE WBC PRESENT,BOTH PMN AND MONONUCLEAR     NO SQUAMOUS EPITHELIAL CELLS SEEN     NO ORGANISMS SEEN     Performed at [facility]   Culture     Final   Value: FEW GROUP B STREP(S.AGALACTIAE)ISOLATED     Note: TESTING AGAINST S. AGALACTIAE NOT ROUTINELY PERFORMED DUE TO PREDICTABILITY OF AMP/PEN/VAN  SUSCEPTIBILITY.     Performed at [facility]   Report Status PENDING   Incomplete     Studies: No results found.  Scheduled Meds: . docusate sodium  100 mg Oral BID  . folic acid  1 mg Oral Daily  . furosemide  40 mg Oral Daily  . heparin  5,000 Units Subcutaneous 3 times per day  . insulin aspart  0-15 Units Subcutaneous TID WC  . insulin aspart  0-5 Units Subcutaneous QHS  . metFORMIN  500 mg Oral BID WC  . multivitamin with minerals  1 tablet Oral Daily  . piperacillin-tazobactam (ZOSYN)  IV  3.375 g Intravenous 3 times per day  . pneumococcal 23 valent vaccine  0.5 mL Intramuscular Tomorrow-1000  . thiamine  100 mg Oral Daily  . vancomycin  1,500 mg Intravenous Q8H   Continuous Infusions:    Principal Problem:   Cellulitis of right lower extremity Active Problems:   Unspecified essential hypertension   Diabetes mellitus   Sleep apnea   Morbid obesity   Cellulitis of leg, right    Time spent: 35 minutes.     Hosie Poisson  [facility] Pager [phone]. If 7PM-7AM, please contact night-coverage at [email or web address], password [facility] 04/14/2014, 2:25 PM  LOS: 3 days

## 2014-04-15 LAB — WOUND CULTURE

## 2014-04-15 LAB — BASIC METABOLIC PANEL
BUN: 11 mg/dL (ref 6–23)
CO2: 29 mEq/L (ref 19–32)
Calcium: 9 mg/dL (ref 8.4–10.5)
Chloride: 101 mEq/L (ref 96–112)
Creatinine, Ser: 1.06 mg/dL (ref 0.50–1.35)
GFR calc Af Amer: >90 mL/min (ref >90)
GFR calc non Af Amer: 85 mL/min — ABNORMAL LOW (ref >90)
Glucose, Bld: 103 mg/dL — ABNORMAL HIGH (ref 70–99)
Potassium: 4.1 mEq/L (ref 3.7–5.3)
SODIUM: 141 meq/L (ref 137–147)

## 2014-04-15 LAB — CBC
HCT: 53.9 % — ABNORMAL HIGH (ref 39.0–52.0)
Hemoglobin: 17.5 g/dL — ABNORMAL HIGH (ref 13.0–17.0)
MCH: 27.2 pg (ref 26.0–34.0)
MCHC: 32.5 g/dL (ref 30.0–36.0)
MCV: 83.7 fL (ref 78.0–100.0)
PLATELETS: 226 10*3/uL (ref 150–400)
RBC: 6.44 MIL/uL — AB (ref 4.22–5.81)
RDW: 14.8 % (ref 11.5–15.5)
WBC: 8.8 10*3/uL (ref 4.0–10.5)

## 2014-04-15 LAB — GLUCOSE, CAPILLARY
Glucose-Capillary: 116 mg/dL — ABNORMAL HIGH (ref 70–99)
Glucose-Capillary: 91 mg/dL (ref 70–99)

## 2014-04-15 LAB — VANCOMYCIN, TROUGH: VANCOMYCIN TR: 15.2 ug/mL (ref 10.0–20.0)

## 2014-04-15 MED ORDER — DOXYCYCLINE HYCLATE 100 MG PO TABS
100.0000 mg | ORAL_TABLET | Freq: Two times a day (BID) | ORAL | Status: DC
  Administered 2014-04-15 – 2014-04-16 (×3): 100 mg via ORAL
  Filled 2014-04-15 (×5): qty 1

## 2014-04-15 MED ORDER — METOPROLOL TARTRATE 12.5 MG HALF TABLET
12.5000 mg | ORAL_TABLET | Freq: Two times a day (BID) | ORAL | Status: DC
  Administered 2014-04-15 – 2014-04-16 (×3): 12.5 mg via ORAL
  Filled 2014-04-15 (×4): qty 1

## 2014-04-15 NOTE — Progress Notes (Signed)
[facility] PROGRESS NOTE  Reyn Faivre JJK:[id number] DOB: [birth year] DOA: 04/11/2014 PCP: No primary provider on file.  Interim summary: Jose Jackson is a 43 y.o. male who has a history of non-compliance with medical therapy presents to the hospital with a cellulitis. Patient actually states that about a month ago he injured his leg. He noted that the area was somewhat red initially. He did not get any attention for this initially. He states that he has noted increased pain in the leg too. Patient has no real complaints of fevers or chills. Patient has not had aabdominal complaints. Patient is a diabetic and is non-compliant. Patient also has a history of hypertension but currently is controlled. He states has been told he has sleep apnea and he is not on CPAP device. He was admitted to [facility] service for further evaluation of the RLE cellulitis and diabetic ulcer. He was started on IV antibiotics and venous duplex ordered. Venous duplex negative for DVT. He was also started on lasix for diuresis. He has an echo done in 2/15 showed good LVEF , no regional wall motion abnormalities, right ventricle and right atrium dilated possibly from sleep apnea. Wound care consulted and recommendations given.   Assessment/Plan:  1. Right lower extremity cellulitis: - admitted to med surg . Completed 4 day sof IV antibiotics and started on doxycycline.   - history of non compliance to medications.  - wound care consulted.  - daily dressing changes.   2. Uncontrolled diabetes Mellitus: hgba1c is 7.6. SSI.  Started patient on metformin.  CBG (last 3)   Recent Labs  04/14/14 1210 04/14/14 1634 04/14/14 2232  GLUCAP 93 116* 91      3. Sleep apnea: - on cpap. Very non compliant.   4. Morbid Obesity -weight loss discussed  -will dietary counseling on discharge   5. Hypertension -currently pressure is suboptimal started on metoprolol 12.5mg  BID.  -will monitor  6. Lower extremity  edema: - echo done in 2015 feb shows good LVEF, with no regional wall motion abnormalities. No diastolic dysfunction.  Lasix ordered for lower extremity edema.   DVT prophylaxis.   Code Status: full code Family Communication: none at bedside Disposition Plan: pending, possibly home in 1 days.    Consultants:  WOUND CARE  Physical therapy  Procedures:  LE RIGHT x ray  Venous duplex.   Antibiotics:  Vancomycin   Zosyn 5/13- 5/17  HPI/Subjective: Pain is better controlled.Swelling has improved and he is ambulating in the hallway.   Objective: Filed Vitals:   04/15/14 0500  BP: 138/95  Pulse: 103  Temp: 98 F (36.7 C)  Resp: 18    Intake/Output Summary (Last 24 hours) at 04/15/14 0846 Last data filed at 04/15/14 0600  Gross per 24 hour  Intake   2250 ml  Output   2970 ml  Net   -720 ml   Filed Weights   04/11/14 2153  Weight: 167.8 kg (369 lb 14.9 oz)    Exam:   General:  Alert afebrile comfortable  Cardiovascular: s1s2  Respiratory: ctab  Abdomen: soft NT nd bs+  Musculoskeletal: bilateral lower extremity 3 + edema.   Right lower extremity swollen more than the LLE, erythematous, warm, tender and 2 ulcerations over the anterior aspect with sero sanguinous drainage, wrapped in bandage. Improved.   Data Reviewed: Basic Metabolic Panel:  Recent Labs Lab 04/11/14 1806 04/12/14 0422 04/13/14 0345 04/15/14 0510  NA 138 139  --  141  K 4.4 4.5  --  4.1  CL 99 99  --  101  CO2 26 30  --  29  GLUCOSE 114* 120*  --  103*  BUN 16 19  --  11  CREATININE 0.85 1.09 1.00 1.06  CALCIUM 8.7 8.6  --  9.0   Liver Function Tests:  Recent Labs Lab 04/11/14 1806 04/12/14 0422  AST 19 18  ALT 18 19  ALKPHOS 64 62  BILITOT 0.9 1.0  PROT 7.0 7.0  ALBUMIN 3.0* 3.1*   No results found for this basename: LIPASE, AMYLASE,  in the last 168 hours No results found for this basename: AMMONIA,  in the last 168 hours CBC:  Recent Labs Lab  04/11/14 1806 04/12/14 0422 04/15/14 0510  WBC 7.3 8.0 8.8  NEUTROABS 3.7  --   --   HGB 17.0 17.3* 17.5*  HCT 53.9* 55.8* 53.9*  MCV 84.2 87.6 83.7  PLT 227 221 226   Cardiac Enzymes: No results found for this basename: CKTOTAL, CKMB, CKMBINDEX, TROPONINI,  in the last 168 hours BNP (last 3 results)  Recent Labs  01/08/14 1020  PROBNP 291.9*   CBG:  Recent Labs Lab 04/13/14 2224 04/14/14 0751 04/14/14 1210 04/14/14 1634 04/14/14 2232  GLUCAP 97 110* 93 116* 91    Recent Results (from the past 240 hour(s))  CULTURE, BLOOD (ROUTINE X 2)     Status: None   Collection Time    04/11/14  5:57 PM      Result Value Ref Range Status   Specimen Description BLOOD LEFT HAND   Final   Special Requests BOTTLES DRAWN AEROBIC AND ANAEROBIC 5ML   Final   Culture  Setup Time     Final   Value: 04/12/2014 01:05     Performed at [facility]   Culture     Final   Value:        BLOOD CULTURE RECEIVED NO GROWTH TO DATE CULTURE WILL BE HELD FOR 5 DAYS BEFORE ISSUING A FINAL NEGATIVE REPORT     Performed at [facility]   Report Status PENDING   Incomplete  CULTURE, BLOOD (ROUTINE X 2)     Status: None   Collection Time    04/11/14  6:03 PM      Result Value Ref Range Status   Specimen Description BLOOD RIGHT HAND   Final   Special Requests BOTTLES DRAWN AEROBIC AND ANAEROBIC 5ML   Final   Culture  Setup Time     Final   Value: 04/12/2014 01:05     Performed at [facility]   Culture     Final   Value:        BLOOD CULTURE RECEIVED NO GROWTH TO DATE CULTURE WILL BE HELD FOR 5 DAYS BEFORE ISSUING A FINAL NEGATIVE REPORT     Performed at [facility]   Report Status PENDING   Incomplete  WOUND CULTURE     Status: None   Collection Time    04/11/14  6:56 PM      Result Value Ref Range Status   Specimen Description WOUND   Final   Special Requests NONE   Final   Gram Stain     Final   Value: RARE WBC PRESENT,BOTH PMN AND MONONUCLEAR     NO  SQUAMOUS EPITHELIAL CELLS SEEN     NO ORGANISMS SEEN     Performed at [facility]   Culture     Final   Value: FEW  GROUP B STREP(S.AGALACTIAE)ISOLATED     Note: TESTING AGAINST S. AGALACTIAE NOT ROUTINELY PERFORMED DUE TO PREDICTABILITY OF AMP/PEN/VAN SUSCEPTIBILITY.     Performed at [facility]   Report Status PENDING   Incomplete     Studies: No results found.  Scheduled Meds: . docusate sodium  100 mg Oral BID  . folic acid  1 mg Oral Daily  . furosemide  40 mg Oral Daily  . heparin  5,000 Units Subcutaneous 3 times per day  . insulin aspart  0-15 Units Subcutaneous TID WC  . insulin aspart  0-5 Units Subcutaneous QHS  . metFORMIN  500 mg Oral BID WC  . metoprolol tartrate  12.5 mg Oral BID  . multivitamin with minerals  1 tablet Oral Daily  . piperacillin-tazobactam (ZOSYN)  IV  3.375 g Intravenous 3 times per day  . pneumococcal 23 valent vaccine  0.5 mL Intramuscular Tomorrow-1000  . thiamine  100 mg Oral Daily  . vancomycin  1,500 mg Intravenous Q8H   Continuous Infusions:    Principal Problem:   Cellulitis of right lower extremity Active Problems:   Unspecified essential hypertension   Diabetes mellitus   Sleep apnea   Morbid obesity   Cellulitis of leg, right    Time spent: 35 minutes.     Hosie Poisson  [facility] Pager [phone]. If 7PM-7AM, please contact night-coverage at [email or web address], password [facility] 04/15/2014, 8:46 AM  LOS: 4 days

## 2014-04-16 LAB — GLUCOSE, CAPILLARY
GLUCOSE-CAPILLARY: 112 mg/dL — AB (ref 70–99)
Glucose-Capillary: 102 mg/dL — ABNORMAL HIGH (ref 70–99)
Glucose-Capillary: 106 mg/dL — ABNORMAL HIGH (ref 70–99)
Glucose-Capillary: 98 mg/dL (ref 70–99)
Glucose-Capillary: 112 mg/dL — ABNORMAL HIGH (ref 70–99)
Glucose-Capillary: 101 mg/dL — ABNORMAL HIGH (ref 70–99)
Glucose-Capillary: 105 mg/dL — ABNORMAL HIGH (ref 70–99)

## 2014-04-16 MED ORDER — DOXYCYCLINE HYCLATE 100 MG PO TABS: 100.0000 mg | ORAL_TABLET | Freq: Two times a day (BID) | ORAL | Status: DC

## 2014-04-16 MED ORDER — LISINOPRIL 2.5 MG PO TABS: 2.5000 mg | ORAL_TABLET | Freq: Every day | ORAL | Status: DC

## 2014-04-16 MED ORDER — HYDROCODONE-ACETAMINOPHEN 5-325 MG PO TABS: 1.0000 | ORAL_TABLET | ORAL | Status: DC | PRN

## 2014-04-16 MED ORDER — THIAMINE HCL 100 MG PO TABS: 100.0000 mg | ORAL_TABLET | Freq: Every day | ORAL | Status: DC

## 2014-04-16 MED ORDER — FOLIC ACID 1 MG PO TABS: 1.0000 mg | ORAL_TABLET | Freq: Every day | ORAL | Status: DC

## 2014-04-16 MED ORDER — LISINOPRIL 2.5 MG PO TABS
2.5000 mg | ORAL_TABLET | Freq: Every day | ORAL | Status: DC
  Administered 2014-04-16: 2.5 mg via ORAL
  Filled 2014-04-16: qty 1

## 2014-04-16 MED ORDER — ADULT MULTIVITAMIN W/MINERALS CH: 1.0000 | ORAL_TABLET | Freq: Every day | ORAL | Status: DC

## 2014-04-16 MED ORDER — METOPROLOL TARTRATE 12.5 MG HALF TABLET: 12.5000 mg | ORAL_TABLET | Freq: Two times a day (BID) | ORAL | Status: DC

## 2014-04-16 MED ORDER — METFORMIN HCL 500 MG PO TABS: 500.0000 mg | ORAL_TABLET | Freq: Two times a day (BID) | ORAL | Status: DC

## 2014-04-16 NOTE — Discharge Summary (Signed)
Physician Discharge Summary  Jose Jackson [id number] DOB: [birth year] DOA: 04/11/2014  PCP: No primary provider on file.  Admit date: 04/11/2014 Discharge date: 04/16/2014  Time spent: 30 minutes  Recommendations for Outpatient Follow-up:  Follow up with PCP in one week Follow up with home health rn  Discharge Diagnoses:  Principal Problem:   Cellulitis of right lower extremity Active Problems:   Unspecified essential hypertension   Diabetes mellitus   Sleep apnea   Morbid obesity   Cellulitis of leg, right   Discharge Condition: improved.   Diet recommendation: carb modified.   Filed Weights   04/11/14 2153  Weight: 167.8 kg (369 lb 14.9 oz)    History of present illness:  Jose Jackson is a 43 y.o. male who has a history of non-compliance with medical therapy presents to the hospital with a cellulitis. Patient actually states that about a month ago he injured his leg. He noted that the area was somewhat red initially. He did not get any attention for this initially. He states that he has noted increased pain in the leg too. Patient has no real complaints of fevers or chills. Patient has not had aabdominal complaints. Patient is a diabetic and is non-compliant. Patient also has a history of hypertension but currently is controlled. He states has been told he has sleep apnea and he is not on CPAP device. He was admitted to [facility] service for further evaluation of the RLE cellulitis and diabetic ulcer. He was started on IV antibiotics and venous duplex ordered. Venous duplex negative for DVT. He was also started on lasix for diuresis. He has an echo done in 2/15 showed good LVEF , no regional wall motion abnormalities, right ventricle and right atrium dilated possibly from sleep apnea. Wound care consulted and recommendations given. He comple ted 5 days of IV antibiotics. His antibiotics were changed to oral doxycycline. His cellulitis has much improved and his diabetic ulcer is  healing. We ordered home health RN for dressing changes.    Hospital Course:  1. Right lower extremity cellulitis: - admitted to med surg . Completed 5 day sof IV antibiotics and started on doxycycline on discharge.  - history of non compliance to medications.  - wound care consulted.  - daily dressing changes.  2. Uncontrolled diabetes Mellitus:  hgba1c is 7.6. SSI.  Started patient on metformin.  CBG (last 3)   Recent Labs   04/14/14 1210  04/14/14 1634  04/14/14 2232   GLUCAP  93  116*  91    3. Sleep apnea:  - on cpap. Very non compliant. He will need another sleep study, for which he has to follow up with pcp and get sleep study.  4. Morbid Obesity -weight loss discussed  -will dietary counseling on discharge  5. Hypertension -currently pressure is suboptimal started on metoprolol 12.5mg  BID and lisinopril..  -recommend followup with PCP in one week.  6. Lower extremity edema:  - echo done in 2015 feb shows good LVEF, with no regional wall motion abnormalities. No diastolic dysfunction.  Lasix ordered for lower extremity edema. Much improvement in the LLE EDEMA.    Procedures:  Venous duplex  X ray of the LLE..   Consultations: Wound care consult.   Discharge Exam: Filed Vitals:   04/16/14 0542  BP: 151/95  Pulse: 104  Temp: 97.8 F (36.6 C)  Resp: 18    General: alert afebrile comfortable Cardiovascular: s1s2 Respiratory: ctab  Discharge Instructions You were cared for by a hospitalist  during your hospital stay. If you have any questions about your discharge medications or the care you received while you were in the hospital after you are discharged, you can call the unit and asked to speak with the hospitalist on call if the hospitalist that took care of you is not available. Once you are discharged, your primary care physician will handle any further medical issues. Please note that NO REFILLS for any discharge medications will be authorized once you  are discharged, as it is imperative that you return to your primary care physician (or establish a relationship with a primary care physician if you do not have one) for your aftercare needs so that they can reassess your need for medications and monitor your lab values.  Discharge Instructions   Diet - low sodium heart healthy    Complete by:  As directed      Diet - low sodium heart healthy    Complete by:  As directed      Discharge instructions    Complete by:  As directed   Follow up with PCP in one week.            Medication List         acetaminophen 325 MG tablet  Commonly known as:  TYLENOL  Take 650 mg by mouth every 6 (six) hours as needed.     doxycycline 100 MG tablet  Commonly known as:  VIBRA-TABS  Take 1 tablet (100 mg total) by mouth every 12 (twelve) hours.     folic acid 1 MG tablet  Commonly known as:  FOLVITE  Take 1 tablet (1 mg total) by mouth daily.     HYDROcodone-acetaminophen 5-325 MG per tablet  Commonly known as:  NORCO/VICODIN  Take 1 tablet by mouth every 4 (four) hours as needed for moderate pain.     lisinopril 2.5 MG tablet  Commonly known as:  PRINIVIL,ZESTRIL  Take 1 tablet (2.5 mg total) by mouth daily.     metFORMIN 500 MG tablet  Commonly known as:  GLUCOPHAGE  Take 1 tablet (500 mg total) by mouth 2 (two) times daily with a meal.     metoprolol tartrate 12.5 mg Tabs tablet  Commonly known as:  LOPRESSOR  Take 0.5 tablets (12.5 mg total) by mouth 2 (two) times daily.     multivitamin with minerals Tabs tablet  Take 1 tablet by mouth daily.     neomycin-bacitracin-polymyxin ointment  Commonly known as:  NEOSPORIN  Apply 1 application topically 2 (two) times daily. With bandage change     thiamine 100 MG tablet  Take 1 tablet (100 mg total) by mouth daily.       Allergies  Allergen Reactions  . Shellfish Allergy Itching      The results of significant diagnostics from this hospitalization (including imaging,  microbiology, ancillary and laboratory) are listed below for reference.    Significant Diagnostic Studies: Dg Tibia/fibula Right  04/11/2014   CLINICAL DATA:  Open wound, infection, cellulitis, diabetes  EXAM: RIGHT TIBIA AND FIBULA - 2 VIEW  COMPARISON:  None.  FINDINGS: Diffuse soft tissue edema noted. Normal alignment without fracture. No acute osseous finding or osseous destruction. No periostitis.  IMPRESSION: Diffuse lower extremity subcutaneous edema. No acute or abnormal osseous finding.   Electronically Signed   By: Daryll Brod M.D.   On: 04/11/2014 18:55    Microbiology: Recent Results (from the past 240 hour(s))  CULTURE, BLOOD (ROUTINE X 2)  Status: None   Collection Time    04/11/14  5:57 PM      Result Value Ref Range Status   Specimen Description BLOOD LEFT HAND   Final   Special Requests BOTTLES DRAWN AEROBIC AND ANAEROBIC 5ML   Final   Culture  Setup Time     Final   Value: 04/12/2014 01:05     Performed at [facility]   Culture     Final   Value:        BLOOD CULTURE RECEIVED NO GROWTH TO DATE CULTURE WILL BE HELD FOR 5 DAYS BEFORE ISSUING A FINAL NEGATIVE REPORT     Performed at [facility]   Report Status PENDING   Incomplete  CULTURE, BLOOD (ROUTINE X 2)     Status: None   Collection Time    04/11/14  6:03 PM      Result Value Ref Range Status   Specimen Description BLOOD RIGHT HAND   Final   Special Requests BOTTLES DRAWN AEROBIC AND ANAEROBIC 5ML   Final   Culture  Setup Time     Final   Value: 04/12/2014 01:05     Performed at [facility]   Culture     Final   Value:        BLOOD CULTURE RECEIVED NO GROWTH TO DATE CULTURE WILL BE HELD FOR 5 DAYS BEFORE ISSUING A FINAL NEGATIVE REPORT     Performed at [facility]   Report Status PENDING   Incomplete  WOUND CULTURE     Status: None   Collection Time    04/11/14  6:56 PM      Result Value Ref Range Status   Specimen Description WOUND   Final   Special Requests NONE    Final   Gram Stain     Final   Value: RARE WBC PRESENT,BOTH PMN AND MONONUCLEAR     NO SQUAMOUS EPITHELIAL CELLS SEEN     NO ORGANISMS SEEN     Performed at [facility]   Culture     Final   Value: FEW GROUP B STREP(S.AGALACTIAE)ISOLATED     Note: TESTING AGAINST S. AGALACTIAE NOT ROUTINELY PERFORMED DUE TO PREDICTABILITY OF AMP/PEN/VAN SUSCEPTIBILITY.     Performed at [facility]   Report Status 04/15/2014 FINAL   Final     Labs: Basic Metabolic Panel:  Recent Labs Lab 04/11/14 1806 04/12/14 0422 04/13/14 0345 04/15/14 0510  NA 138 139  --  141  K 4.4 4.5  --  4.1  CL 99 99  --  101  CO2 26 30  --  29  GLUCOSE 114* 120*  --  103*  BUN 16 19  --  11  CREATININE 0.85 1.09 1.00 1.06  CALCIUM 8.7 8.6  --  9.0   Liver Function Tests:  Recent Labs Lab 04/11/14 1806 04/12/14 0422  AST 19 18  ALT 18 19  ALKPHOS 64 62  BILITOT 0.9 1.0  PROT 7.0 7.0  ALBUMIN 3.0* 3.1*   No results found for this basename: LIPASE, AMYLASE,  in the last 168 hours No results found for this basename: AMMONIA,  in the last 168 hours CBC:  Recent Labs Lab 04/11/14 1806 04/12/14 0422 04/15/14 0510  WBC 7.3 8.0 8.8  NEUTROABS 3.7  --   --   HGB 17.0 17.3* 17.5*  HCT 53.9* 55.8* 53.9*  MCV 84.2 87.6 83.7  PLT 227 221 226   Cardiac Enzymes:  No results found for this basename: CKTOTAL, CKMB, CKMBINDEX, TROPONINI,  in the last 168 hours BNP: BNP (last 3 results)  Recent Labs  01/08/14 1020  PROBNP 291.9*   CBG:  Recent Labs Lab 04/14/14 2232 04/15/14 0756 04/15/14 1150 04/15/14 1647 04/15/14 2212  GLUCAP 91 112* 102* 106* 98       Signed:  Chandrea Zellman  [facility] 04/16/2014, 11:24 AM

## 2014-04-18 LAB — CULTURE, BLOOD (ROUTINE X 2)
CULTURE: NO GROWTH
Culture: NO GROWTH

## 2014-04-18 NOTE — Progress Notes (Signed)
Appt made with Dr Zigmund Daniel for 04/30/14 @3 :30 pm. Called pt at [phone] to make him aware of it. I provided him with the address and phone 3 of the clinic and emphasized to him the importance of keeping the appt.  Allene Dillon RN BSN   [phone]

## 2014-04-30 ENCOUNTER — Encounter: Payer: Self-pay | Admitting: Internal Medicine

## 2014-04-30 ENCOUNTER — Ambulatory Visit (INDEPENDENT_AMBULATORY_CARE_PROVIDER_SITE_OTHER): Payer: Medicare Other | Admitting: Internal Medicine

## 2014-04-30 VITALS — BP 158/88 | HR 100 | Temp 98.3°F | Resp 20 | Ht 75.0 in | Wt 371.0 lb

## 2014-04-30 DIAGNOSIS — D45 Polycythemia vera: Secondary | ICD-10-CM

## 2014-04-30 DIAGNOSIS — L97909 Non-pressure chronic ulcer of unspecified part of unspecified lower leg with unspecified severity: Secondary | ICD-10-CM

## 2014-04-30 DIAGNOSIS — E1165 Type 2 diabetes mellitus with hyperglycemia: Secondary | ICD-10-CM

## 2014-04-30 DIAGNOSIS — E1151 Type 2 diabetes mellitus with diabetic peripheral angiopathy without gangrene: Secondary | ICD-10-CM

## 2014-04-30 DIAGNOSIS — IMO0002 Reserved for concepts with insufficient information to code with codable children: Secondary | ICD-10-CM | POA: Insufficient documentation

## 2014-04-30 DIAGNOSIS — E1169 Type 2 diabetes mellitus with other specified complication: Secondary | ICD-10-CM

## 2014-04-30 DIAGNOSIS — G459 Transient cerebral ischemic attack, unspecified: Secondary | ICD-10-CM

## 2014-04-30 DIAGNOSIS — D751 Secondary polycythemia: Secondary | ICD-10-CM | POA: Insufficient documentation

## 2014-04-30 DIAGNOSIS — I10 Essential (primary) hypertension: Secondary | ICD-10-CM

## 2014-04-30 DIAGNOSIS — G4733 Obstructive sleep apnea (adult) (pediatric): Secondary | ICD-10-CM | POA: Insufficient documentation

## 2014-04-30 DIAGNOSIS — E11622 Type 2 diabetes mellitus with other skin ulcer: Secondary | ICD-10-CM | POA: Insufficient documentation

## 2014-04-30 DIAGNOSIS — E1159 Type 2 diabetes mellitus with other circulatory complications: Secondary | ICD-10-CM

## 2014-04-30 MED ORDER — ASPIRIN EC 325 MG PO TBEC: 325.0000 mg | DELAYED_RELEASE_TABLET | Freq: Every day | ORAL | Status: DC

## 2014-04-30 MED ORDER — LISINOPRIL 10 MG PO TABS: 10.0000 mg | ORAL_TABLET | Freq: Every day | ORAL | Status: DC
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
## 2014-05-01 ENCOUNTER — Telehealth: Payer: Self-pay

## 2014-05-01 ENCOUNTER — Telehealth: Payer: Self-pay | Admitting: Internal Medicine

## 2014-05-01 NOTE — Telephone Encounter (Signed)
Called patient to schedule appointment for labs and EKG. Phone number is disconnected. Contacted emergency contact and asked that patient call us to schedule.

## 2014-05-01 NOTE — Telephone Encounter (Signed)
Per  Dr. Zigmund Daniel request Pt will need to  have Appointment set up to RTC in 2 Weeks for CPE After that he needs a 1 mo F/U visit.Pt will need to RTC for Lab Appointment FASTING LABS on this day.

## 2014-05-02 ENCOUNTER — Other Ambulatory Visit: Payer: Self-pay | Admitting: Internal Medicine

## 2014-05-02 ENCOUNTER — Telehealth: Payer: Self-pay

## 2014-05-02 DIAGNOSIS — D751 Secondary polycythemia: Secondary | ICD-10-CM

## 2014-05-02 DIAGNOSIS — G4733 Obstructive sleep apnea (adult) (pediatric): Secondary | ICD-10-CM

## 2014-05-02 NOTE — Telephone Encounter (Signed)
[facility] was contacted Per Dr. Zigmund Daniel request concerning an Overnight Pulse Ox.Spoke W/ Danae Chen ,she stated that on Clinic letterhead the letter would need to state the reason why Pt is in need of this as well as Dx,also NPI#. Letter will be faxed over as soon as information is received.

## 2014-05-09 ENCOUNTER — Telehealth: Payer: Self-pay | Admitting: Internal Medicine

## 2014-05-09 ENCOUNTER — Telehealth: Payer: Self-pay

## 2014-05-09 ENCOUNTER — Encounter: Payer: Self-pay | Admitting: Internal Medicine

## 2014-05-09 NOTE — Telephone Encounter (Signed)
Attempt to contact Pt has not been successfull.Pt's Emergency contact has not contacted office w/ new # for Pt nore has Pt reached out to clinic to give new contact #.Letter will be leaving office on today.

## 2014-05-09 NOTE — Telephone Encounter (Signed)
Attempted to contact patient at telephone number given by [place]. Telephone number is out of service.

## 2014-07-20 ENCOUNTER — Telehealth: Payer: Self-pay | Admitting: Internal Medicine

## 2014-07-20 NOTE — Telephone Encounter (Signed)
Called to schedule follow up appointment. No answer, unable to leave voicemail.

## 2014-08-17 ENCOUNTER — Emergency Department (HOSPITAL_COMMUNITY): Payer: Medicare Other

## 2014-08-17 ENCOUNTER — Emergency Department (HOSPITAL_COMMUNITY)
Admission: EM | Admit: 2014-08-17 | Discharge: 2014-08-17 | Disposition: A | Discharge status: Home / Self Care | Payer: Medicare Other | Attending: Emergency Medicine | Admitting: Emergency Medicine

## 2014-08-17 ENCOUNTER — Encounter (HOSPITAL_COMMUNITY): Payer: Self-pay | Admitting: Emergency Medicine

## 2014-08-17 DIAGNOSIS — T2015XA Burn of first degree of scalp [any part], initial encounter: Secondary | ICD-10-CM | POA: Insufficient documentation

## 2014-08-17 DIAGNOSIS — R000 Tachycardia, unspecified: Secondary | ICD-10-CM | POA: Diagnosis not present

## 2014-08-17 DIAGNOSIS — T2014XA Burn of first degree of nose (septum), initial encounter: Secondary | ICD-10-CM | POA: Diagnosis not present

## 2014-08-17 DIAGNOSIS — Z87891 Personal history of nicotine dependence: Secondary | ICD-10-CM | POA: Diagnosis not present

## 2014-08-17 DIAGNOSIS — T2005XA Burn of unspecified degree of scalp [any part], initial encounter: Secondary | ICD-10-CM | POA: Insufficient documentation

## 2014-08-17 DIAGNOSIS — Y92009 Unspecified place in unspecified non-institutional (private) residence as the place of occurrence of the external cause: Secondary | ICD-10-CM | POA: Insufficient documentation

## 2014-08-17 DIAGNOSIS — R0902 Hypoxemia: Secondary | ICD-10-CM | POA: Insufficient documentation

## 2014-08-17 DIAGNOSIS — I10 Essential (primary) hypertension: Secondary | ICD-10-CM | POA: Insufficient documentation

## 2014-08-17 DIAGNOSIS — T59811A Toxic effect of smoke, accidental (unintentional), initial encounter: Secondary | ICD-10-CM

## 2014-08-17 DIAGNOSIS — J705 Respiratory conditions due to smoke inhalation: Secondary | ICD-10-CM | POA: Insufficient documentation

## 2014-08-17 DIAGNOSIS — T24119A Burn of first degree of unspecified thigh, initial encounter: Secondary | ICD-10-CM | POA: Insufficient documentation

## 2014-08-17 DIAGNOSIS — T22139A Burn of first degree of unspecified upper arm, initial encounter: Secondary | ICD-10-CM | POA: Insufficient documentation

## 2014-08-17 DIAGNOSIS — X020XXA Exposure to flames in controlled fire in building or structure, initial encounter: Secondary | ICD-10-CM | POA: Diagnosis not present

## 2014-08-17 DIAGNOSIS — E669 Obesity, unspecified: Secondary | ICD-10-CM | POA: Insufficient documentation

## 2014-08-17 DIAGNOSIS — Y9389 Activity, other specified: Secondary | ICD-10-CM | POA: Insufficient documentation

## 2014-08-17 DIAGNOSIS — T300 Burn of unspecified body region, unspecified degree: Secondary | ICD-10-CM

## 2014-08-17 DIAGNOSIS — T2010XA Burn of first degree of head, face, and neck, unspecified site, initial encounter: Secondary | ICD-10-CM

## 2014-08-17 LAB — PREPARE FRESH FROZEN PLASMA

## 2014-08-17 LAB — CBC WITH DIFFERENTIAL/PLATELET
BASOS ABS: 0 10*3/uL (ref 0.0–0.1)
BASOS PCT: 0 % (ref 0–1)
EOS ABS: 0 10*3/uL (ref 0.0–0.7)
Eosinophils Relative: 0 % (ref 0–5)
HCT: 53.5 % — ABNORMAL HIGH (ref 39.0–52.0)
Hemoglobin: 16.8 g/dL (ref 13.0–17.0)
Lymphocytes Relative: 27 % (ref 12–46)
Lymphs Abs: 2.8 10*3/uL (ref 0.7–4.0)
MCH: 27.7 pg (ref 26.0–34.0)
MCHC: 31.4 g/dL (ref 30.0–36.0)
MCV: 88.1 fL (ref 78.0–100.0)
Monocytes Absolute: 0.8 10*3/uL (ref 0.1–1.0)
Monocytes Relative: 8 % (ref 3–12)
NEUTROS ABS: 6.4 10*3/uL (ref 1.7–7.7)
NEUTROS PCT: 65 % (ref 43–77)
PLATELETS: 219 10*3/uL (ref 150–400)
RBC: 6.07 MIL/uL — ABNORMAL HIGH (ref 4.22–5.81)
RDW: 15 % (ref 11.5–15.5)
WBC: 10 10*3/uL (ref 4.0–10.5)

## 2014-08-17 LAB — CARBOXYHEMOGLOBIN
CARBOXYHEMOGLOBIN: 3.8 % — AB (ref 0.5–1.5)
Methemoglobin: 0.7 % (ref 0.0–1.5)
O2 SAT: 99.4 %
Total hemoglobin: 17.2 g/dL (ref 13.5–18.0)

## 2014-08-17 LAB — BASIC METABOLIC PANEL
ANION GAP: 13 (ref 5–15)
BUN: 15 mg/dL (ref 6–23)
CHLORIDE: 101 meq/L (ref 96–112)
CO2: 26 mEq/L (ref 19–32)
Calcium: 8.2 mg/dL — ABNORMAL LOW (ref 8.4–10.5)
Creatinine, Ser: 0.91 mg/dL (ref 0.50–1.35)
GFR calc non Af Amer: >90 mL/min (ref >90)
Glucose, Bld: 116 mg/dL — ABNORMAL HIGH (ref 70–99)
POTASSIUM: 4.1 meq/L (ref 3.7–5.3)
SODIUM: 140 meq/L (ref 137–147)

## 2014-08-17 LAB — ABO/RH: ABO/RH(D): O POS

## 2014-08-17 MED ORDER — TRAMADOL HCL 50 MG PO TABS: 50.0000 mg | ORAL_TABLET | Freq: Four times a day (QID) | ORAL | Status: DC | PRN

## 2014-08-17 MED ORDER — LORAZEPAM 2 MG/ML IJ SOLN: 1.0000 mg | Freq: Once | INTRAMUSCULAR | Status: DC
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
## 2014-08-17 NOTE — Discharge Instructions (Signed)
As discussed, your evaluation today has been largely reassuring.  But, it is important that you monitor your condition carefully, and do not hesitate to return to the ED if you develop new, or concerning changes in your condition.  In particular, if you find herself having any new increased difficulty breathing, any loss of consciousness, or any concerning pain, please be sure to return here for further evaluation and management.  Otherwise, please follow-up with your physician for appropriate ongoing care.   Burn Care Your skin is a natural barrier to infection. It is the largest organ of your body. Burns damage this natural protection. To help prevent infection, it is very important to follow your caregiver's instructions in the care of your burn. Burns are classified as:  First degree. There is only redness of the skin (erythema). No scarring is expected.  Second degree. There is blistering of the skin. Scarring may occur with deeper burns.  Third degree. All layers of the skin are injured, and scarring is expected. HOME CARE INSTRUCTIONS   Wash your hands well before changing your bandage.  Change your bandage as often as directed by your caregiver.  Remove the old bandage. If the bandage sticks, you may soak it off with cool, clean water.  Cleanse the burn thoroughly but gently with mild soap and water.  Pat the area dry with a clean, dry cloth.  Apply a thin layer of antibacterial cream to the burn.  Apply a clean bandage as instructed by your caregiver.  Keep the bandage as clean and dry as possible.  Elevate the affected area for the first 24 hours, then as instructed by your caregiver.  Only take over-the-counter or prescription medicines for pain, discomfort, or fever as directed by your caregiver. SEEK IMMEDIATE MEDICAL CARE IF:   You develop excessive pain.  You develop redness, tenderness, swelling, or red streaks near the burn.  The burned area develops  yellowish-white fluid (pus) or a bad smell.  You have a fever. MAKE SURE YOU:   Understand these instructions.  Will watch your condition.  Will get help right away if you are not doing well or get worse. Document Released: 11/16/2005 Document Revised: 02/08/2012 Document Reviewed: 04/08/2011 [facility] Patient Information 2015 [place], [place]. This information is not intended to replace advice given to you by your health care provider. Make sure you discuss any questions you have with your health care provider.
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
## 2014-08-18 LAB — TYPE AND SCREEN
ABO/RH(D): O POS
Antibody Screen: NEGATIVE
UNIT DIVISION: 0
UNIT DIVISION: 0

## 2014-08-18 LAB — CDS SEROLOGY

## 2014-08-20 ENCOUNTER — Encounter: Payer: Self-pay | Admitting: Internal Medicine

## 2015-05-27 ENCOUNTER — Other Ambulatory Visit: Payer: Self-pay

## 2016-01-12 IMAGING — CR DG CHEST 1V PORT
1 series · 1 of 1 positions shown · non-contrast
Comparison: None.

CLINICAL DATA: Recent current injury

EXAM:
PORTABLE CHEST - 1 VIEW

[AP]
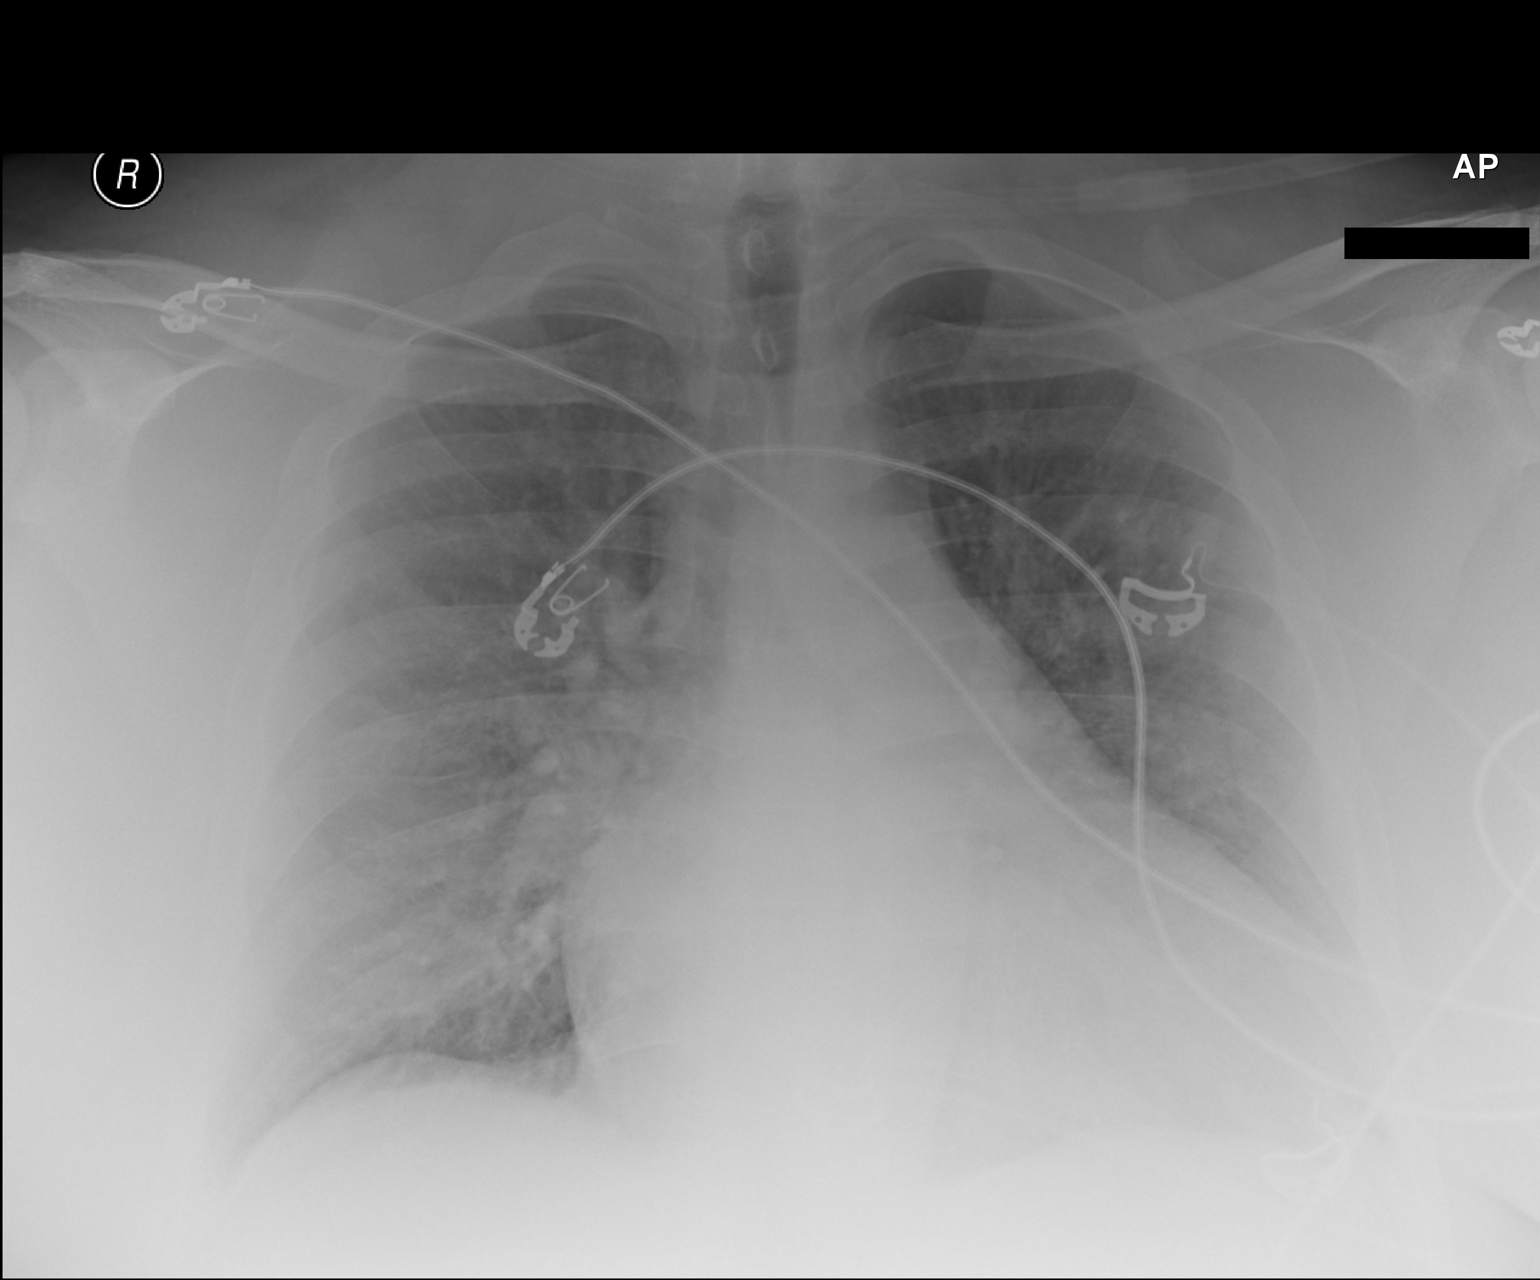

[1 of 1 positions shown; findings below may reference images not displayed]

FINDINGS: Cardiac shadow is enlarged. Mild vascular congestion is noted. No
focal infiltrate is seen. No bony abnormality is noted.
IMPRESSION: Mild vascular congestion.

## 2016-09-24 ENCOUNTER — Other Ambulatory Visit: Payer: Self-pay | Admitting: Internal Medicine

## 2016-09-24 ENCOUNTER — Encounter: Payer: Self-pay | Admitting: Internal Medicine

## 2016-09-24 ENCOUNTER — Non-Acute Institutional Stay (SKILLED_NURSING_FACILITY): Payer: Medicare Other | Admitting: Internal Medicine

## 2016-09-24 DIAGNOSIS — E11622 Type 2 diabetes mellitus with other skin ulcer: Secondary | ICD-10-CM | POA: Diagnosis not present

## 2016-09-24 DIAGNOSIS — G4733 Obstructive sleep apnea (adult) (pediatric): Secondary | ICD-10-CM

## 2016-09-24 DIAGNOSIS — I5042 Chronic combined systolic (congestive) and diastolic (congestive) heart failure: Secondary | ICD-10-CM | POA: Insufficient documentation

## 2016-09-24 DIAGNOSIS — L97909 Non-pressure chronic ulcer of unspecified part of unspecified lower leg with unspecified severity: Secondary | ICD-10-CM | POA: Diagnosis not present

## 2016-09-24 NOTE — Patient Instructions (Signed)
See specific orders & goals under each diagnosis 

## 2016-09-24 NOTE — Assessment & Plan Note (Signed)
Sleep specialty consultation as soon as possible

## 2016-09-24 NOTE — Assessment & Plan Note (Signed)
Wound care protocol
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
## 2016-09-24 NOTE — Assessment & Plan Note (Signed)
Risk discussed frankly. Sleep apnea may present as a disturbed sleep pattern, significant fatigue, heart rhythm disturbance,or edema ( swelling of the extremities).  If untreated the sleep apnea can lead to irreversible heart failure and health or life threatening cardiac rhythm issues

## 2016-10-07 ENCOUNTER — Encounter (HOSPITAL_COMMUNITY): Payer: Self-pay | Admitting: *Deleted

## 2016-10-07 ENCOUNTER — Ambulatory Visit (HOSPITAL_COMMUNITY)
Admission: RE | Admit: 2016-10-07 | Discharge: 2016-10-07 | Disposition: A | Discharge status: Home / Self Care | Payer: PRIVATE HEALTH INSURANCE | Source: Ambulatory Visit | Attending: Internal Medicine | Admitting: Internal Medicine

## 2016-10-07 ENCOUNTER — Encounter (HOSPITAL_COMMUNITY): Payer: Self-pay | Admitting: Internal Medicine

## 2016-10-07 VITALS — BP 128/62 | HR 72 | Wt 396.8 lb

## 2016-10-07 DIAGNOSIS — Z8249 Family history of ischemic heart disease and other diseases of the circulatory system: Secondary | ICD-10-CM | POA: Insufficient documentation

## 2016-10-07 DIAGNOSIS — I5021 Acute systolic (congestive) heart failure: Secondary | ICD-10-CM | POA: Diagnosis present

## 2016-10-07 DIAGNOSIS — Z7982 Long term (current) use of aspirin: Secondary | ICD-10-CM | POA: Insufficient documentation

## 2016-10-07 DIAGNOSIS — Z833 Family history of diabetes mellitus: Secondary | ICD-10-CM | POA: Insufficient documentation

## 2016-10-07 DIAGNOSIS — E11622 Type 2 diabetes mellitus with other skin ulcer: Secondary | ICD-10-CM | POA: Diagnosis not present

## 2016-10-07 DIAGNOSIS — I110 Hypertensive heart disease with heart failure: Secondary | ICD-10-CM | POA: Insufficient documentation

## 2016-10-07 DIAGNOSIS — I5022 Chronic systolic (congestive) heart failure: Secondary | ICD-10-CM | POA: Diagnosis not present

## 2016-10-07 DIAGNOSIS — M1990 Unspecified osteoarthritis, unspecified site: Secondary | ICD-10-CM | POA: Insufficient documentation

## 2016-10-07 DIAGNOSIS — Z7984 Long term (current) use of oral hypoglycemic drugs: Secondary | ICD-10-CM | POA: Insufficient documentation

## 2016-10-07 DIAGNOSIS — G4733 Obstructive sleep apnea (adult) (pediatric): Secondary | ICD-10-CM | POA: Diagnosis not present

## 2016-10-07 DIAGNOSIS — L97909 Non-pressure chronic ulcer of unspecified part of unspecified lower leg with unspecified severity: Secondary | ICD-10-CM | POA: Insufficient documentation

## 2016-10-07 DIAGNOSIS — Z91013 Allergy to seafood: Secondary | ICD-10-CM | POA: Diagnosis not present

## 2016-10-07 DIAGNOSIS — Z9119 Patient's noncompliance with other medical treatment and regimen: Secondary | ICD-10-CM | POA: Diagnosis not present

## 2016-10-07 DIAGNOSIS — F418 Other specified anxiety disorders: Secondary | ICD-10-CM | POA: Diagnosis not present

## 2016-10-07 DIAGNOSIS — Z823 Family history of stroke: Secondary | ICD-10-CM | POA: Insufficient documentation

## 2016-10-07 DIAGNOSIS — I2510 Atherosclerotic heart disease of native coronary artery without angina pectoris: Secondary | ICD-10-CM | POA: Diagnosis not present

## 2016-10-07 DIAGNOSIS — Z87891 Personal history of nicotine dependence: Secondary | ICD-10-CM | POA: Insufficient documentation

## 2016-10-07 MED ORDER — SACUBITRIL-VALSARTAN 49-51 MG PO TABS: 1.0000 | ORAL_TABLET | Freq: Two times a day (BID) | ORAL | 3 refills | Status: AC

## 2016-10-07 MED ORDER — SPIRONOLACTONE 25 MG PO TABS: 12.5000 mg | ORAL_TABLET | Freq: Every day | ORAL | 3 refills | Status: AC

## 2016-10-07 NOTE — Progress Notes (Signed)
ADVANCED HF CLINIC CONSULT NOTE  Referring Physician: Dr. Verneita Griffes Primary Care: Primary Cardiologist:  HPI:  45 y/o male with obesity, HTN, DM2, OSA, LE wounds.  In 10/17 was admitted to [facility] 10/8 for congestive heart failure and treatment of the leg wound. Patient had been treated with BiPAP at night but had been noncompliant. He was diuresed, echocardiogram revealed an ejection fraction of 25%.  Was at [facility] 10/11-10/24/17 for wound care and nutrition management. Now at [facility] around without too much problem. Wounds healing slowly. Occasional chest fluttering but no overt angina. + peripheral edema. Not wearing CPAP.    Labs: 09/09/16 Cr 0.81 k 3.9   Review of Systems: [y] = yes, [ ]  = no   General: Weight gain [ y]; Weight loss [ ] ; Anorexia [ ] ; Fatigue [ ] ; Fever [ ] ; Chills [ ] ; Weakness [ y]  Cardiac: Chest pain/pressure [ ] ; Resting SOB [ ] ; Exertional SOB [ ] ; Orthopnea [ ] ; Pedal Edema Blue.Reese ]; Palpitations [ ] ; Syncope [ ] ; Presyncope [ ] ; Paroxysmal nocturnal dyspnea[ ]   Pulmonary: Cough [ ] ; Wheezing[ ] ; Hemoptysis[ ] ; Sputum [ ] ; Snoring Blue.Reese ]  GI: Vomiting[ ] ; Dysphagia[ ] ; Melena[ ] ; Hematochezia [ ] ; Heartburn[ ] ; Abdominal pain [ ] ; Constipation [ ] ; Diarrhea [ ] ; BRBPR [ ]   GU: Hematuria[ ] ; Dysuria [ ] ; Nocturia[ ]   Vascular: Pain in legs with walking Blue.Reese ]; Pain in feet with lying flat [ ] ; Non-healing sores [ ] ; Stroke [ ] ; TIA [ ] ; Slurred speech [ ] ;  Neuro: Headaches[ ] ; Vertigo[ ] ; Seizures[ ] ; Paresthesias[ ] ;Blurred vision [ ] ; Diplopia [ ] ; Vision changes [ ]   Ortho/Skin: Arthritis Blue.Reese ]; Joint pain Blue.Reese ]; Muscle pain [ ] ; Joint swelling [ ] ; Back Pain [ ] ; Rash [ ]   Psych: Depression[ ] ; Anxiety[ ]   Heme: Bleeding problems [ ] ; Clotting disorders [ ] ; Anemia [ ]   Endocrine: Diabetes Blue.Reese ]; Thyroid dysfunction[ ]    Past Medical History:  Diagnosis Date  . Anxiety   . Arthritis    KNEES & HIPS  . Depression   .  Diabetes mellitus without complication ([place])   . Hypertension   . Sleep apnea   . Ulcer of calf due to diabetes [facility])     Current Outpatient Prescriptions  Medication Sig Dispense Refill  . acetaminophen (TYLENOL) 500 MG tablet Take 500 mg by mouth every 6 (six) hours as needed.    . ALBUTEROL SULFATE HFA IN Inhale into the lungs daily as needed.    Marland Kitchen amLODipine (NORVASC) 10 MG tablet Take 10 mg by mouth daily.    Marland Kitchen aspirin EC 81 MG tablet Take 81 mg by mouth daily.    . carvedilol (COREG) 25 MG tablet Take 25 mg by mouth 2 (two) times daily with a meal.    . famotidine (PEPCID) 20 MG tablet Take 20 mg by mouth 2 (two) times daily.    . furosemide (LASIX) 40 MG tablet Take 40 mg by mouth daily.    Marland Kitchen gabapentin (NEURONTIN) 300 MG capsule Take 300 mg by mouth at bedtime.    Marland Kitchen ipratropium-albuterol (DUONEB) 0.5-2.5 (3) MG/3ML SOLN Take 3 mLs by nebulization every 6 (six) hours as needed.    Marland Kitchen lisinopril (PRINIVIL,ZESTRIL) 20 MG tablet Take 20 mg by mouth daily.    . Magnesium Hydroxide (MILK OF MAGNESIA PO) Take 30 Units by mouth 3 times/day as needed-between meals & bedtime.    . Melatonin  3 MG TABS Take 12 mg by mouth at bedtime for rest    . metFORMIN (GLUCOPHAGE) 500 MG tablet Take 1 tablet (500 mg total) by mouth 2 (two) times daily with a meal. 60 tablet 0  . potassium chloride (K-DUR) 10 MEQ tablet Take 10 mEq by mouth daily.    . traMADol (ULTRAM) 50 MG tablet Take by mouth every 6 (six) hours as needed.    . venlafaxine (EFFEXOR) 37.5 MG tablet Take 37.5 mg by mouth 2 (two) times daily.     No current facility-administered medications for this encounter.     Allergies  Allergen Reactions  . Shellfish Allergy Itching      Social History   Social History  . Marital status: Single    Spouse name: N/A  . Number of children: N/A  . Years of education: N/A   Occupational History  . Not on file.   Social History Main Topics  . Smoking status: Former Research scientist (life sciences)  . Smokeless  tobacco: Never Used  . Alcohol use No  . Drug use: No  . Sexual activity: Not on file   Other Topics Concern  . Not on file   Social History Narrative   ** Merged History Encounter **          Family History  Problem Relation Age of Onset  . Diabetes type I Mother   . Cirrhosis Father   . Stroke Neg Hx   . Heart disease Neg Hx   . Cancer Neg Hx     Vitals:   10/07/16 1002  BP: 128/62  Pulse: 72  SpO2: 97%  Weight: (!) 396 lb 12 oz (180 kg)    PHYSICAL EXAM: General:  Obese male in [facility]. Falls asleep during interview.  No respiratory difficulty HEENT: normal Neck: supple. no JVD. Carotids 2+ bilat; no bruits. No lymphadenopathy or thryomegaly appreciated. Cor: PMI nondisplaced. Regular rate & rhythm. No rubs, gallops or murmurs. Lungs: clear Abdomen: markedly obese soft, nontender, nondistended. No hepatosplenomegaly. No bruits or masses. Good bowel sounds. Extremities: no cyanosis, clubbing, rash, thick brawny edema. Very hard.  Neuro: alert & oriented x 3, cranial nerves grossly intact. moves all 4 extremities w/o difficulty. Affect pleasant.  ECG: NSR 72 No ST-T wave abnormalities.    ASSESSMENT & PLAN: 1. Acute systolic HF 2. Morbid obesity 3. HTN 4. Untreated OSA 5. LE ulcers  Overall volume status looks pretty good but hard to tell given his size. Given CRFs high concern for underlying CAD. Will plan R/L cath to further evaluate. Continue to titrate HF meds. Will need OSA treated.   Stop lisinopril add entresto 49/51. Start spiro 12.5 daily. Labs 1 week.   Bensimhon, Daniel,MD 1:27 PM

## 2016-10-07 NOTE — Patient Instructions (Signed)
START Spironolactone 12.5mg  (1/2 tablet) daily.  STOP Lisinopril  On 10/09/2016 START Entresto 49/51mg  (1 tablet) twice daily.  Your provider has arranged for you to have a right and left heart cath next week. **PLEASE SEE ATTACHED CATH INSTRUCTIONS**  Follow up with Dr.Bensimhon in 4-6 weeks

## 2016-10-08 ENCOUNTER — Encounter: Payer: Self-pay | Admitting: Internal Medicine

## 2016-10-08 ENCOUNTER — Other Ambulatory Visit (HOSPITAL_COMMUNITY): Payer: Self-pay | Admitting: *Deleted

## 2016-10-08 ENCOUNTER — Non-Acute Institutional Stay (SKILLED_NURSING_FACILITY): Payer: Medicare Other | Admitting: Internal Medicine

## 2016-10-08 DIAGNOSIS — I5022 Chronic systolic (congestive) heart failure: Secondary | ICD-10-CM

## 2016-10-08 DIAGNOSIS — I5042 Chronic combined systolic (congestive) and diastolic (congestive) heart failure: Secondary | ICD-10-CM | POA: Diagnosis not present

## 2016-10-08 DIAGNOSIS — G4733 Obstructive sleep apnea (adult) (pediatric): Secondary | ICD-10-CM | POA: Diagnosis not present

## 2016-10-08 NOTE — Assessment & Plan Note (Addendum)
Cardiology plans right and left cath 11/14. CPAP or BiPAP clinically indicated to treat the unaddressed sleep apnea. Pulmonary/sleep medicine consultation will be requested as soon as possible. Clinically cellulitis is not present It was recommended that the pressure hose be left off as these are essentially acting as a tourniquet. Amlodipine may be a role in the profound edema. The amlodipine will be held and the blood pressure and edema response assessed

## 2016-10-08 NOTE — Patient Instructions (Signed)
See Current Assessment & Plan in Problem List under specific Diagnosis 

## 2016-10-08 NOTE — Assessment & Plan Note (Addendum)
As per cardiology Check hepatorenal function and BNP

## 2016-10-08 NOTE — Progress Notes (Signed)
    Facility Location: [facility]  Room Number: 119   Code Status:   This is a nursing facility follow up for specific acute issue of possible cellulitis.  Interim medical record and care since last [place] visit was updated with review of diagnostic studies and change in clinical status since last visit were documented.  HPI: He adamantly claims that he has cellulitis. Other than sweating he has no constitutional symptoms. He denies any associated purulence of the leg wounds or lymphangitic signs. He denies any active cardiopulmonary symptoms other than the edema which has been progressive. He's been using the compression hose over the calves with subsequent increasing edema in the thighs. He states that the wound of the dorsum of left foot is associated with some discomfort and also pain which radiates into the arch. He has not been seen as yet for his sleep apnea retesting. His CPAP machine is at his home in [place]. He states he has no one who can bring it to him here. Cardiology evaluated him 11/8; Lisinopril was discontinued and Entresto initiated. Additionally spironolactone 12.5 mg was prescribed. He is scheduled to have a right & left heart cath 11/14.  Review of systems: Constitutional: No fever,significant weight change   Cardiovascular: No chest pain, palpitations,paroxysmal nocturnal dyspnea, claudication  Respiratory: No cough, sputum production,hemoptysis, DOE , significant snoring,apnea   Gastrointestinal: No heartburn,dysphagia,abdominal pain, nausea / vomiting,rectal bleeding, melena,change in bowels Genitourinary: No dysuria,hematuria, pyuria,  incontinence, nocturia Dermatologic: No new rash, pruritus Neurologic: No dizziness,headache,syncope, seizures, numbness , tingling Endocrine: No change in hair/skin/ nails, excessive thirst, excessive hunger, excessive urination  Hematologic/lymphatic: No significant bruising,  lymphadenopathy,abnormal bleeding   Physical exam:  Pertinent or positive findings: He has profound obesity Head is shaven. He is missing multiple teeth. The oropharynx cannot be visualized due to crowding Chest surprisingly clear S4 is present. Abdomen is massive. Dullness to percussion in the right upper quadrant. Large umbilical hernia is noted. Tense edema of the lower extremities. There is a keloid keratotic changes of the shins with scattered small ulcers. These do not reveal any evidence of purulence or cellulitis. The ulcer of the dorsum of the left foot  has a benign base. Pedal pulses are decreased. Homans sign is negative. Toenails are thickened and deformed.  General appearance:Adequately nourished; no acute distress , increased work of breathing is present.   Lymphatic: No lymphadenopathy about the head, neck, axilla . Eyes: No conjunctival inflammation or lid edema is present. There is no scleral icterus. Ears:  External ear exam shows no significant lesions or deformities.   Nose:  External nasal examination shows no deformity or inflammation. Nasal mucosa are pink and moist without lesions ,exudates Oral exam: lips and gums are healthy appearing.There is no oropharyngeal erythema or exudate . Neck:  No thyromegaly, masses, tenderness noted.    Heart:  Normal rate and regular rhythm. S1 and S2 normal without gallop, murmur, click, rub .  Lungs:Chest clear to auscultation without wheezes, rhonchi,rales , rubs. Abdomen:Bowel sounds are normal. Abdomen is  nontender with no organomegaly, hernias,masses. GU: deferred  Extremities:  No cyanosis, clubbing Neurologic exam : Strength equal  in upper & lower extremities Skin: Warm & dry w/o tenting. No significant rash.    See summary under each active problem in the Problem List with associated updated therapeutic plan

## 2016-10-12 LAB — HEPATIC FUNCTION PANEL
ALK PHOS: 84 U/L (ref 25–125)
ALT: 12 U/L (ref 10–40)
AST: 17 U/L (ref 14–40)
Bilirubin, Total: 0.8 mg/dL

## 2016-10-12 LAB — BASIC METABOLIC PANEL
BUN: 14 mg/dL (ref 4–21)
CREATININE: 0.7 mg/dL (ref 0.6–1.3)
Glucose: 190 mg/dL
POTASSIUM: 4.8 mmol/L (ref 3.4–5.3)
SODIUM: 138 mmol/L (ref 137–147)

## 2016-10-13 ENCOUNTER — Ambulatory Visit (HOSPITAL_COMMUNITY)
Admission: RE | Admit: 2016-10-13 | Discharge: 2016-10-13 | Disposition: A | Discharge status: Skilled Nursing Facility | Payer: Medicare Other | Source: Ambulatory Visit | Attending: Internal Medicine | Admitting: Internal Medicine

## 2016-10-13 ENCOUNTER — Encounter (HOSPITAL_COMMUNITY)
Admission: RE | Disposition: A | Discharge status: Skilled Nursing Facility | Payer: Self-pay | Source: Ambulatory Visit | Attending: Internal Medicine

## 2016-10-13 DIAGNOSIS — I110 Hypertensive heart disease with heart failure: Secondary | ICD-10-CM | POA: Diagnosis not present

## 2016-10-13 DIAGNOSIS — Z79899 Other long term (current) drug therapy: Secondary | ICD-10-CM | POA: Insufficient documentation

## 2016-10-13 DIAGNOSIS — E11622 Type 2 diabetes mellitus with other skin ulcer: Secondary | ICD-10-CM | POA: Diagnosis not present

## 2016-10-13 DIAGNOSIS — Z7984 Long term (current) use of oral hypoglycemic drugs: Secondary | ICD-10-CM | POA: Insufficient documentation

## 2016-10-13 DIAGNOSIS — I5022 Chronic systolic (congestive) heart failure: Secondary | ICD-10-CM

## 2016-10-13 DIAGNOSIS — Z87891 Personal history of nicotine dependence: Secondary | ICD-10-CM | POA: Insufficient documentation

## 2016-10-13 DIAGNOSIS — I428 Other cardiomyopathies: Secondary | ICD-10-CM | POA: Insufficient documentation

## 2016-10-13 DIAGNOSIS — Z7982 Long term (current) use of aspirin: Secondary | ICD-10-CM | POA: Insufficient documentation

## 2016-10-13 DIAGNOSIS — I272 Pulmonary hypertension, unspecified: Secondary | ICD-10-CM | POA: Insufficient documentation

## 2016-10-13 DIAGNOSIS — Z833 Family history of diabetes mellitus: Secondary | ICD-10-CM | POA: Diagnosis not present

## 2016-10-13 DIAGNOSIS — Z6841 Body Mass Index (BMI) 40.0 and over, adult: Secondary | ICD-10-CM | POA: Insufficient documentation

## 2016-10-13 DIAGNOSIS — Z9119 Patient's noncompliance with other medical treatment and regimen: Secondary | ICD-10-CM | POA: Diagnosis not present

## 2016-10-13 DIAGNOSIS — G4733 Obstructive sleep apnea (adult) (pediatric): Secondary | ICD-10-CM | POA: Insufficient documentation

## 2016-10-13 DIAGNOSIS — L97209 Non-pressure chronic ulcer of unspecified calf with unspecified severity: Secondary | ICD-10-CM | POA: Insufficient documentation

## 2016-10-13 HISTORY — PX: CARDIAC CATHETERIZATION: SHX172

## 2016-10-13 LAB — BASIC METABOLIC PANEL
Anion gap: 6 (ref 5–15)
BUN: 11 mg/dL (ref 6–20)
CHLORIDE: 97 mmol/L — AB (ref 101–111)
CO2: 30 mmol/L (ref 22–32)
Calcium: 8.8 mg/dL — ABNORMAL LOW (ref 8.9–10.3)
Creatinine, Ser: 0.83 mg/dL (ref 0.61–1.24)
GFR calc Af Amer: >60 mL/min (ref >60)
GFR calc non Af Amer: >60 mL/min (ref >60)
GLUCOSE: 153 mg/dL — AB (ref 65–99)
POTASSIUM: 4.4 mmol/L (ref 3.5–5.1)
Sodium: 133 mmol/L — ABNORMAL LOW (ref 135–145)

## 2016-10-13 LAB — CBC
HEMATOCRIT: 52.9 % — AB (ref 39.0–52.0)
HEMOGLOBIN: 16.9 g/dL (ref 13.0–17.0)
MCH: 28.6 pg (ref 26.0–34.0)
MCHC: 31.9 g/dL (ref 30.0–36.0)
MCV: 89.5 fL (ref 78.0–100.0)
Platelets: 221 10*3/uL (ref 150–400)
RBC: 5.91 MIL/uL — ABNORMAL HIGH (ref 4.22–5.81)
RDW: 14.7 % (ref 11.5–15.5)
WBC: 8.8 10*3/uL (ref 4.0–10.5)

## 2016-10-13 LAB — PROTIME-INR
INR: 1.03
Prothrombin Time: 13.5 seconds (ref 11.4–15.2)

## 2016-10-13 LAB — POCT I-STAT 3, VENOUS BLOOD GAS (G3P V)
Acid-Base Excess: 2 mmol/L (ref 0.0–2.0)
Acid-Base Excess: 1 mmol/L (ref 0.0–2.0)
BICARBONATE: 30.3 mmol/L — AB (ref 20.0–28.0)
Bicarbonate: 31.7 mmol/L — ABNORMAL HIGH (ref 20.0–28.0)
O2 Saturation: 66 %
O2 Saturation: 66 %
PCO2 VEN: 64.5 mmHg — AB (ref 44.0–60.0)
PCO2 VEN: 68.3 mmHg — AB (ref 44.0–60.0)
PH VEN: 7.28 (ref 7.250–7.430)
TCO2: 32 mmol/L (ref 0–100)
TCO2: 34 mmol/L (ref 0–100)
pH, Ven: 7.274 (ref 7.250–7.430)
pO2, Ven: 40 mmHg (ref 32.0–45.0)
pO2, Ven: 40 mmHg (ref 32.0–45.0)

## 2016-10-13 LAB — POCT I-STAT 3, ART BLOOD GAS (G3+)
Acid-Base Excess: 1 mmol/L (ref 0.0–2.0)
Bicarbonate: 30.6 mmol/L — ABNORMAL HIGH (ref 20.0–28.0)
O2 Saturation: 91 %
PCO2 ART: 66.3 mmHg — AB (ref 32.0–48.0)
PH ART: 7.272 — AB (ref 7.350–7.450)
PO2 ART: 70 mmHg — AB (ref 83.0–108.0)
TCO2: 33 mmol/L (ref 0–100)

## 2016-10-13 LAB — POCT ACTIVATED CLOTTING TIME: Activated Clotting Time: 202 seconds

## 2016-10-13 LAB — GLUCOSE, CAPILLARY
GLUCOSE-CAPILLARY: 137 mg/dL — AB (ref 65–99)
Glucose-Capillary: 154 mg/dL — ABNORMAL HIGH (ref 65–99)

## 2016-10-13 SURGERY — RIGHT/LEFT HEART CATH AND CORONARY ANGIOGRAPHY

## 2016-10-13 MED ORDER — HEPARIN (PORCINE) IN NACL 2-0.9 UNIT/ML-% IJ SOLN
INTRAMUSCULAR | Status: DC | PRN
  Administered 2016-10-13: 1000 mL

## 2016-10-13 MED ORDER — SODIUM CHLORIDE 0.9 % IV SOLN
INTRAVENOUS | Status: DC
  Administered 2016-10-13: 11:00:00 via INTRAVENOUS

## 2016-10-13 MED ORDER — IOPAMIDOL (ISOVUE-370) INJECTION 76%
INTRAVENOUS | Status: AC
  Filled 2016-10-13: qty 100

## 2016-10-13 MED ORDER — IOPAMIDOL (ISOVUE-370) INJECTION 76%
INTRAVENOUS | Status: DC | PRN
  Administered 2016-10-13: 85 mL via INTRA_ARTERIAL

## 2016-10-13 MED ORDER — SODIUM CHLORIDE 0.9 % IV SOLN: 250.0000 mL | INTRAVENOUS | Status: DC | PRN

## 2016-10-13 MED ORDER — ACETAMINOPHEN 325 MG PO TABS: 650.0000 mg | ORAL_TABLET | ORAL | Status: DC | PRN

## 2016-10-13 MED ORDER — LIDOCAINE HCL (PF) 1 % IJ SOLN
INTRAMUSCULAR | Status: AC
  Filled 2016-10-13: qty 30

## 2016-10-13 MED ORDER — HEPARIN (PORCINE) IN NACL 2-0.9 UNIT/ML-% IJ SOLN
INTRAMUSCULAR | Status: DC | PRN
  Administered 2016-10-13: 14:00:00 via INTRA_ARTERIAL

## 2016-10-13 MED ORDER — SODIUM CHLORIDE 0.9% FLUSH: 3.0000 mL | INTRAVENOUS | Status: DC | PRN

## 2016-10-13 MED ORDER — HEPARIN SODIUM (PORCINE) 1000 UNIT/ML IJ SOLN
INTRAMUSCULAR | Status: DC | PRN
  Administered 2016-10-13: 7000 [IU] via INTRAVENOUS

## 2016-10-13 MED ORDER — HEPARIN SODIUM (PORCINE) 1000 UNIT/ML IJ SOLN
INTRAMUSCULAR | Status: AC
  Filled 2016-10-13: qty 1

## 2016-10-13 MED ORDER — SODIUM CHLORIDE 0.9% FLUSH: 3.0000 mL | Freq: Two times a day (BID) | INTRAVENOUS | Status: DC

## 2016-10-13 MED ORDER — SODIUM CHLORIDE 0.9 % IV SOLN: INTRAVENOUS | Status: AC

## 2016-10-13 MED ORDER — VERAPAMIL HCL 2.5 MG/ML IV SOLN
INTRAVENOUS | Status: AC
  Filled 2016-10-13: qty 2

## 2016-10-13 MED ORDER — ONDANSETRON HCL 4 MG/2ML IJ SOLN
4.0000 mg | Freq: Four times a day (QID) | INTRAMUSCULAR | Status: DC | PRN
Start: 2016-10-13 — End: 2016-10-13

## 2016-10-13 MED ORDER — HEPARIN (PORCINE) IN NACL 2-0.9 UNIT/ML-% IJ SOLN
INTRAMUSCULAR | Status: AC
  Filled 2016-10-13: qty 1000

## 2016-10-13 MED ORDER — SODIUM CHLORIDE 0.9% FLUSH
3.0000 mL | INTRAVENOUS | Status: DC | PRN
Start: 2016-10-13 — End: 2016-10-13

## 2016-10-13 MED ORDER — ASPIRIN 81 MG PO CHEW: 81.0000 mg | CHEWABLE_TABLET | ORAL | Status: DC

## 2016-10-13 MED ORDER — LIDOCAINE HCL (PF) 1 % IJ SOLN
INTRAMUSCULAR | Status: DC | PRN
  Administered 2016-10-13: 5 mL
  Administered 2016-10-13: 2 mL

## 2016-10-13 SURGICAL SUPPLY — 16 items
CATH BALLN WEDGE 5F 110CM (CATHETERS) ×2 IMPLANT
CATH INFINITI 5 FR JL3.5 (CATHETERS) ×2 IMPLANT
CATH INFINITI 5FR ANG PIGTAIL (CATHETERS) ×2 IMPLANT
CATH INFINITI 5FR JL4 (CATHETERS) ×2 IMPLANT
CATH INFINITI JR4 5F (CATHETERS) ×2 IMPLANT
DEVICE RAD COMP TR BAND LRG (VASCULAR PRODUCTS) ×2 IMPLANT
GLIDESHEATH SLEND SS 6F .021 (SHEATH) ×2 IMPLANT
GUIDEWIRE INQWIRE 1.5J.035X260 (WIRE) IMPLANT
INQWIRE 1.5J .035X260CM (WIRE) ×3
KIT HEART LEFT (KITS) ×3 IMPLANT
PACK CARDIAC CATHETERIZATION (CUSTOM PROCEDURE TRAY) ×3 IMPLANT
SHEATH FAST CATH BRACH 5F 5CM (SHEATH) ×2 IMPLANT
SYR MEDRAD MARK V 150ML (SYRINGE) ×3 IMPLANT
TRANSDUCER W/STOPCOCK (MISCELLANEOUS) ×3 IMPLANT
TUBING CIL FLEX 10 FLL-RA (TUBING) ×3 IMPLANT
WIRE HI TORQ VERSACORE-J 145CM (WIRE) ×2 IMPLANT

## 2016-10-13 NOTE — Interval H&P Note (Signed)
History and Physical Interval Note:  10/13/2016 1:28 PM  Adelene Idler  has presented today for surgery, with the diagnosis of hf  The various methods of treatment have been discussed with the patient and family. After consideration of risks, benefits and other options for treatment, the patient has consented to  Procedure(s): Right/Left Heart Cath and Coronary Angiography (N/A) and possible angioplasty as a surgical intervention .  The patient's history has been reviewed, patient examined, no change in status, stable for surgery.  I have reviewed the patient's chart and labs.  Questions were answered to the patient's satisfaction.    Cath Lab Visit (complete for each Cath Lab visit)  Clinical Evaluation Leading to the Procedure:   ACS: No.  Non-ACS:    Anginal Classification: CCS III  Anti-ischemic medical therapy: Minimal Therapy (1 class of medications)  Non-Invasive Test Results: No non-invasive testing performed  Prior CABG: No previous CABG         Bensimhon, Quillian Quince

## 2016-10-13 NOTE — Progress Notes (Signed)
Pt reports that due to sleep apnea he does not sleep at night so sleeps frequently during the day. Pt lethargic, unable to stay awake long enough to complete a sentence but responds to voice and is oriented. Anderson Malta, RN in and aware.

## 2016-10-13 NOTE — H&P (View-Only) (Signed)
ADVANCED HF CLINIC CONSULT NOTE  Referring Physician: Dr. Verneita Griffes Primary Care: Primary Cardiologist:  HPI:  45 y/o male with obesity, HTN, DM2, OSA, LE wounds.  In 10/17 was admitted to [facility] 10/8 for congestive heart failure and treatment of the leg wound. Patient had been treated with BiPAP at night but had been noncompliant. He was diuresed, echocardiogram revealed an ejection fraction of 25%.  Was at [facility] 10/11-10/24/17 for wound care and nutrition management. Now at [facility] around without too much problem. Wounds healing slowly. Occasional chest fluttering but no overt angina. + peripheral edema. Not wearing CPAP.    Labs: 09/09/16 Cr 0.81 k 3.9   Review of Systems: [y] = yes, [ ]  = no   General: Weight gain [ y]; Weight loss [ ] ; Anorexia [ ] ; Fatigue [ ] ; Fever [ ] ; Chills [ ] ; Weakness [ y]  Cardiac: Chest pain/pressure [ ] ; Resting SOB [ ] ; Exertional SOB [ ] ; Orthopnea [ ] ; Pedal Edema Blue.Reese ]; Palpitations [ ] ; Syncope [ ] ; Presyncope [ ] ; Paroxysmal nocturnal dyspnea[ ]   Pulmonary: Cough [ ] ; Wheezing[ ] ; Hemoptysis[ ] ; Sputum [ ] ; Snoring Blue.Reese ]  GI: Vomiting[ ] ; Dysphagia[ ] ; Melena[ ] ; Hematochezia [ ] ; Heartburn[ ] ; Abdominal pain [ ] ; Constipation [ ] ; Diarrhea [ ] ; BRBPR [ ]   GU: Hematuria[ ] ; Dysuria [ ] ; Nocturia[ ]   Vascular: Pain in legs with walking Blue.Reese ]; Pain in feet with lying flat [ ] ; Non-healing sores [ ] ; Stroke [ ] ; TIA [ ] ; Slurred speech [ ] ;  Neuro: Headaches[ ] ; Vertigo[ ] ; Seizures[ ] ; Paresthesias[ ] ;Blurred vision [ ] ; Diplopia [ ] ; Vision changes [ ]   Ortho/Skin: Arthritis Blue.Reese ]; Joint pain Blue.Reese ]; Muscle pain [ ] ; Joint swelling [ ] ; Back Pain [ ] ; Rash [ ]   Psych: Depression[ ] ; Anxiety[ ]   Heme: Bleeding problems [ ] ; Clotting disorders [ ] ; Anemia [ ]   Endocrine: Diabetes Blue.Reese ]; Thyroid dysfunction[ ]    Past Medical History:  Diagnosis Date  . Anxiety   . Arthritis    KNEES & HIPS  . Depression   .  Diabetes mellitus without complication ([place])   . Hypertension   . Sleep apnea   . Ulcer of calf due to diabetes [facility])     Current Outpatient Prescriptions  Medication Sig Dispense Refill  . acetaminophen (TYLENOL) 500 MG tablet Take 500 mg by mouth every 6 (six) hours as needed.    . ALBUTEROL SULFATE HFA IN Inhale into the lungs daily as needed.    Marland Kitchen amLODipine (NORVASC) 10 MG tablet Take 10 mg by mouth daily.    Marland Kitchen aspirin EC 81 MG tablet Take 81 mg by mouth daily.    . carvedilol (COREG) 25 MG tablet Take 25 mg by mouth 2 (two) times daily with a meal.    . famotidine (PEPCID) 20 MG tablet Take 20 mg by mouth 2 (two) times daily.    . furosemide (LASIX) 40 MG tablet Take 40 mg by mouth daily.    Marland Kitchen gabapentin (NEURONTIN) 300 MG capsule Take 300 mg by mouth at bedtime.    Marland Kitchen ipratropium-albuterol (DUONEB) 0.5-2.5 (3) MG/3ML SOLN Take 3 mLs by nebulization every 6 (six) hours as needed.    Marland Kitchen lisinopril (PRINIVIL,ZESTRIL) 20 MG tablet Take 20 mg by mouth daily.    . Magnesium Hydroxide (MILK OF MAGNESIA PO) Take 30 Units by mouth 3 times/day as needed-between meals & bedtime.    . Melatonin  3 MG TABS Take 12 mg by mouth at bedtime for rest    . metFORMIN (GLUCOPHAGE) 500 MG tablet Take 1 tablet (500 mg total) by mouth 2 (two) times daily with a meal. 60 tablet 0  . potassium chloride (K-DUR) 10 MEQ tablet Take 10 mEq by mouth daily.    . traMADol (ULTRAM) 50 MG tablet Take by mouth every 6 (six) hours as needed.    . venlafaxine (EFFEXOR) 37.5 MG tablet Take 37.5 mg by mouth 2 (two) times daily.     No current facility-administered medications for this encounter.     Allergies  Allergen Reactions  . Shellfish Allergy Itching      Social History   Social History  . Marital status: Single    Spouse name: N/A  . Number of children: N/A  . Years of education: N/A   Occupational History  . Not on file.   Social History Main Topics  . Smoking status: Former Research scientist (life sciences)  . Smokeless  tobacco: Never Used  . Alcohol use No  . Drug use: No  . Sexual activity: Not on file   Other Topics Concern  . Not on file   Social History Narrative   ** Merged History Encounter **          Family History  Problem Relation Age of Onset  . Diabetes type I Mother   . Cirrhosis Father   . Stroke Neg Hx   . Heart disease Neg Hx   . Cancer Neg Hx     Vitals:   10/07/16 1002  BP: 128/62  Pulse: 72  SpO2: 97%  Weight: (!) 396 lb 12 oz (180 kg)    PHYSICAL EXAM: General:  Obese male in [facility]. Falls asleep during interview.  No respiratory difficulty HEENT: normal Neck: supple. no JVD. Carotids 2+ bilat; no bruits. No lymphadenopathy or thryomegaly appreciated. Cor: PMI nondisplaced. Regular rate & rhythm. No rubs, gallops or murmurs. Lungs: clear Abdomen: markedly obese soft, nontender, nondistended. No hepatosplenomegaly. No bruits or masses. Good bowel sounds. Extremities: no cyanosis, clubbing, rash, thick brawny edema. Very hard.  Neuro: alert & oriented x 3, cranial nerves grossly intact. moves all 4 extremities w/o difficulty. Affect pleasant.  ECG: NSR 72 No ST-T wave abnormalities.    ASSESSMENT & PLAN: 1. Acute systolic HF 2. Morbid obesity 3. HTN 4. Untreated OSA 5. LE ulcers  Overall volume status looks pretty good but hard to tell given his size. Given CRFs high concern for underlying CAD. Will plan R/L cath to further evaluate. Continue to titrate HF meds. Will need OSA treated.   Stop lisinopril add entresto 49/51. Start spiro 12.5 daily. Labs 1 week.   Lizzeth Meder,MD 1:27 PM

## 2016-10-13 NOTE — Discharge Instructions (Signed)
NO METFORMIN/GLUCOPHAGE FOR 2 DAYS ° ° ° ° °Radial Site Care °Introduction °Refer to this sheet in the next few weeks. These instructions provide you with information about caring for yourself after your procedure. Your health care provider may also give you more specific instructions. Your treatment has been planned according to current medical practices, but problems sometimes occur. Call your health care provider if you have any problems or questions after your procedure. °What can I expect after the procedure? °After your procedure, it is typical to have the following: °· Bruising at the radial site that usually fades within 1-2 weeks. °· Blood collecting in the tissue (hematoma) that may be painful to the touch. It should usually decrease in size and tenderness within 1-2 weeks. °Follow these instructions at home: °· Take medicines only as directed by your health care provider. °· You may shower 24-48 hours after the procedure or as directed by your health care provider. Remove the bandage (dressing) and gently wash the site with plain soap and water. Pat the area dry with a clean towel. Do not rub the site, because this may cause bleeding. °· Do not take baths, swim, or use a hot tub until your health care provider approves. °· Check your insertion site every day for redness, swelling, or drainage. °· Do not apply powder or lotion to the site. °· Do not flex or bend the affected arm for 24 hours or as directed by your health care provider. °· Do not push or pull heavy objects with the affected arm for 24 hours or as directed by your health care provider. °· Do not lift over 10 lb (4.5 kg) for 5 days after your procedure or as directed by your health care provider. °· Ask your health care provider when it is okay to: °¨ Return to work or school. °¨ Resume usual physical activities or sports. °¨ Resume sexual activity. °· Do not drive home if you are discharged the same day as the procedure. Have someone else  drive you. °· You may drive 24 hours after the procedure unless otherwise instructed by your health care provider. °· Do not operate machinery or power tools for 24 hours after the procedure. °· If your procedure was done as an outpatient procedure, which means that you went home the same day as your procedure, a responsible adult should be with you for the first 24 hours after you arrive home. °· Keep all follow-up visits as directed by your health care provider. This is important. °Contact a health care provider if: °· You have a fever. °· You have chills. °· You have increased bleeding from the radial site. Hold pressure on the site. °Get help right away if: °· You have unusual pain at the radial site. °· You have redness, warmth, or swelling at the radial site. °· You have drainage (other than a small amount of blood on the dressing) from the radial site. °· The radial site is bleeding, and the bleeding does not stop after 30 minutes of holding steady pressure on the site. °· Your arm or hand becomes pale, cool, tingly, or numb. °This information is not intended to replace advice given to you by your health care provider. Make sure you discuss any questions you have with your health care provider. °Document Released: 12/19/2010 Document Revised: 04/23/2016 Document Reviewed: 06/04/2014 °© 2017 Elsevier ° °

## 2016-10-13 NOTE — Progress Notes (Signed)
Site area: rt ac brachial venous sheath Site Prior to Removal:  Level 0 Pressure Applied For:  15 minutes Manual:   yes Patient Status During Pull:  stable Post Pull Site:  Level 0 Post Pull Instructions Given:  yes Post Pull Pulses Present: yes Dressing Applied:  tegaderm/gauze Bedrest begins @ [id number] Comments:

## 2016-10-14 ENCOUNTER — Encounter (HOSPITAL_COMMUNITY): Payer: Self-pay | Admitting: Internal Medicine

## 2016-10-15 ENCOUNTER — Encounter: Payer: Self-pay | Admitting: Internal Medicine

## 2016-10-15 ENCOUNTER — Non-Acute Institutional Stay (SKILLED_NURSING_FACILITY): Payer: Medicare Other | Admitting: Internal Medicine

## 2016-10-15 ENCOUNTER — Telehealth: Payer: Self-pay | Admitting: Emergency Medicine

## 2016-10-15 DIAGNOSIS — E1165 Type 2 diabetes mellitus with hyperglycemia: Secondary | ICD-10-CM | POA: Diagnosis not present

## 2016-10-15 DIAGNOSIS — IMO0002 Reserved for concepts with insufficient information to code with codable children: Secondary | ICD-10-CM

## 2016-10-15 DIAGNOSIS — E1151 Type 2 diabetes mellitus with diabetic peripheral angiopathy without gangrene: Secondary | ICD-10-CM

## 2016-10-15 DIAGNOSIS — I5042 Chronic combined systolic (congestive) and diastolic (congestive) heart failure: Secondary | ICD-10-CM

## 2016-10-15 DIAGNOSIS — G4733 Obstructive sleep apnea (adult) (pediatric): Secondary | ICD-10-CM

## 2016-10-15 NOTE — Telephone Encounter (Signed)
Attempted to call [facility] and the pt.  No answer and no VM for either place. Will try back later.

## 2016-10-15 NOTE — Assessment & Plan Note (Signed)
He held the Norvasc until 11/13 as per cardiology instructions. It has been restarted  with his other cardiac meds which are prescribed by Dr.Bensimhon

## 2016-10-15 NOTE — Assessment & Plan Note (Signed)
Metformin is on hold following a cardiac cath, it will be restarted 10/16/16

## 2016-10-15 NOTE — Patient Instructions (Addendum)
Read all labels ; consume LESS than 40 grams (preferably none) of sugar / day from foods & drinks with  High Fructose Corn Syrup as #1, 2 , 3 or # 4 on label. Note : dividing the "grams of sugar" on label by 4 gives "teaspoons of sugar" content of food or drink. For example a 22 oz Coke has 68 grams of sugar or 17 tsp of sugar). Cardiology will prescribe your cardiac drugs. Gabapentin can be taken every 8 hours as needed. This may cause somnolence as we discussed. Please take your pill bottles to the pharmacy and to all medical appointments.

## 2016-10-15 NOTE — Progress Notes (Signed)
Facility Location: [facility]  Room Number: 119  Code Status: Full Code  The patient is being discharged from [facility] on this date by Unice Cobble MD.  PCP: No PCP Per Patient; he is amenable to establishing with [facility] if this can be arranged. I shall contact Dr Ree Kida.  The medical history in this facility was reviewed and summarized and medical problem list was updated. Time spent and note content is documented as follows.  Summary of [facility] medical records: The patient had been in [facility] 10/11-10/24/17 for wound care & nutrition management ,admitted with left lower extremities cellulitis with venous stasis wounds in the context of type 2 diabetes, chronic systolic congestive heart failure super morbid obesity, obstructive sleep apnea. Prior to that admission he had been in the [facility] for heart failure. He did participate in physical therapy with some improvement in ADL and ambulation. Wound care addressed the leg lesions He underwent right and left cardiac catheterization 10/13/16. This showed PA pressures of 64/19 (normal 35-40) . Right ventricular pressure was 64/-2. He was felt to have moderate pulmonary hypertension with right ventricular strain. He did exhibit normal coronary arteries and was diagnosed with nonischemic cardiomyopathy based on ejection fraction 25-30 percent. Subjectively he is significantly improved. Pending are nocturnal sleep study at [facility] and pulmonary consultation for his untreated sleep apnea. In the past he has been intolerant to CPAP due to claustrophobia and anxiety. I have requested assessment for possible nasal CPAP trial if possibly applicable.. He has diabetes with neuropathy. He was receiving  Gabapentin at bedtime. The last A1c on record was 7.6% on 04/12/14.  Review of systems: His exertional dyspnea and paroxysmal nocturnal dyspnea have  improved. Exercise tolerance is also improved. The left shin lesions have not demonstrated evidence of acute cellulitis and are also clinically improved.   Physical exam:  Pertinent or positive findings:He is morbidly obese. Head is shaven. Ptosis of the right eye is present. He is missing multiple teeth. Lungs are silent. 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 He has tense edema lower extremities. Pedal pulses are not palpable. There are pigmented, hyperkeratotic regions over the shins. The wounds in the left shin and foot are dressed.   General appearance:Adequately nourished; no acute distress , increased work of breathing is present.   Lymphatic: No lymphadenopathy about the head, neck, axilla . Eyes: No conjunctival inflammation or lid edema is present. There is no scleral icterus. Ears:  External ear exam shows no significant lesions or deformities.   Nose:  External nasal examination shows no deformity or inflammation. Nasal mucosa are pink and moist without lesions ,exudates Oral exam: lips and gums are healthy appearing. Neck:  No thyromegaly, masses, tenderness noted.    Heart:  Normal rate and regular rhythm. S1 and S2 normal without gallop, murmur, click, rub .  Lungs:without wheezes, rhonchi,rales , rubs. Abdomen:Bowel sounds are normal. Abdomen is  nontender. GU: deferred as previously addressed. Extremities:  No cyanosis, clubbing  Skin: Warm & dry w/o tenting. No significant rash.  See clinical summary of Discharge Diagnoses in the Problem List with associated updated therapeutic plan  Detailed discussion of the pathophysiology of untreated sleep apnea was discussed with him.Additionally the right and left heart cath findings were discussed along with their significance and prognosis. Dietary intervention with restriction high fructose corn syrup sugars was stressed. The diagrams and written instructions were provided to him. He expressed comprehension of  the  recommendations and plan.

## 2016-10-15 NOTE — Assessment & Plan Note (Signed)
Pending are sleep medicine consultation and repeat sleep study at [facility] He understands the risk of untreated sleep apnea Hopefully he is a candidate for a nasal device or some adaptation of his CPAP equipment which will improve compliance.

## 2016-10-16 NOTE — Telephone Encounter (Signed)
LM for [place] with [facility]. I spoke with pt who was confused and unsure if he needed to f/u with [place]. Will await Kendra's call back.

## 2016-10-20 NOTE — Telephone Encounter (Signed)
Called and spoke with [facility] and they stated that they pt has already been discharged from their facility.

## 2016-11-20 ENCOUNTER — Encounter (HOSPITAL_COMMUNITY): Payer: Medicare Other | Admitting: Internal Medicine

## 2017-09-30 DEATH — deceased
